# Patient Record
Sex: Male | Born: 1965 | Race: White | Hispanic: No | Marital: Married | Smoking: Never smoker
Health system: Southern US, Community
[De-identification: ages and names within clinical notes are randomized; demographics above are authoritative.]

## PROBLEM LIST (undated history)

## (undated) DIAGNOSIS — I10 Essential (primary) hypertension: Secondary | ICD-10-CM

## (undated) DIAGNOSIS — Z9109 Other allergy status, other than to drugs and biological substances: Secondary | ICD-10-CM

## (undated) DIAGNOSIS — J45909 Unspecified asthma, uncomplicated: Secondary | ICD-10-CM

## (undated) HISTORY — DX: Other allergy status, other than to drugs and biological substances: Z91.09

## (undated) HISTORY — DX: Essential (primary) hypertension: I10

## (undated) HISTORY — DX: Unspecified asthma, uncomplicated: J45.909

---

## 2001-04-18 HISTORY — PX: HERNIA REPAIR: SHX51

## 2001-04-18 HISTORY — PX: VASECTOMY: SHX75

## 2004-01-14 ENCOUNTER — Emergency Department (HOSPITAL_COMMUNITY): Admission: EM | Admit: 2004-01-14 | Discharge: 2004-01-14 | Payer: Self-pay | Admitting: Emergency Medicine

## 2004-02-23 ENCOUNTER — Ambulatory Visit: Payer: Self-pay | Admitting: Psychology

## 2004-03-01 ENCOUNTER — Ambulatory Visit: Payer: Self-pay | Admitting: Pulmonary Disease

## 2004-03-15 ENCOUNTER — Ambulatory Visit: Payer: Self-pay | Admitting: Psychology

## 2004-03-29 ENCOUNTER — Ambulatory Visit: Payer: Self-pay | Admitting: Psychology

## 2004-04-05 ENCOUNTER — Ambulatory Visit: Payer: Self-pay | Admitting: Psychology

## 2004-05-03 ENCOUNTER — Ambulatory Visit: Payer: Self-pay | Admitting: Psychology

## 2004-05-17 ENCOUNTER — Ambulatory Visit: Payer: Self-pay | Admitting: Psychology

## 2004-06-28 ENCOUNTER — Ambulatory Visit: Payer: Self-pay | Admitting: Psychology

## 2004-07-12 ENCOUNTER — Ambulatory Visit: Payer: Self-pay | Admitting: Psychology

## 2004-08-09 ENCOUNTER — Ambulatory Visit: Payer: Self-pay | Admitting: Psychology

## 2004-08-23 ENCOUNTER — Ambulatory Visit: Payer: Self-pay | Admitting: Psychology

## 2004-09-20 ENCOUNTER — Ambulatory Visit: Payer: Self-pay | Admitting: Psychology

## 2004-10-04 ENCOUNTER — Ambulatory Visit: Payer: Self-pay | Admitting: Psychology

## 2004-10-18 ENCOUNTER — Ambulatory Visit: Payer: Self-pay | Admitting: Psychology

## 2004-11-15 ENCOUNTER — Ambulatory Visit: Payer: Self-pay | Admitting: Psychology

## 2004-12-03 ENCOUNTER — Ambulatory Visit: Payer: Self-pay | Admitting: Psychology

## 2004-12-13 ENCOUNTER — Ambulatory Visit: Payer: Self-pay | Admitting: Psychology

## 2004-12-27 ENCOUNTER — Ambulatory Visit: Payer: Self-pay | Admitting: Psychology

## 2005-02-21 ENCOUNTER — Ambulatory Visit: Payer: Self-pay | Admitting: Psychology

## 2005-03-07 ENCOUNTER — Ambulatory Visit: Payer: Self-pay | Admitting: Psychology

## 2005-04-19 ENCOUNTER — Ambulatory Visit: Payer: Self-pay | Admitting: Pulmonary Disease

## 2005-05-02 ENCOUNTER — Ambulatory Visit: Payer: Self-pay | Admitting: Psychology

## 2005-05-16 ENCOUNTER — Ambulatory Visit: Payer: Self-pay | Admitting: Psychology

## 2005-05-30 ENCOUNTER — Ambulatory Visit: Payer: Self-pay | Admitting: Psychology

## 2005-06-13 ENCOUNTER — Ambulatory Visit: Payer: Self-pay | Admitting: Psychology

## 2005-06-27 ENCOUNTER — Ambulatory Visit: Payer: Self-pay | Admitting: Psychology

## 2005-08-08 ENCOUNTER — Ambulatory Visit: Payer: Self-pay | Admitting: Psychology

## 2005-09-19 ENCOUNTER — Ambulatory Visit: Payer: Self-pay | Admitting: Psychology

## 2005-10-03 ENCOUNTER — Ambulatory Visit: Payer: Self-pay | Admitting: Psychology

## 2005-10-24 ENCOUNTER — Ambulatory Visit: Payer: Self-pay | Admitting: Psychology

## 2005-11-14 ENCOUNTER — Ambulatory Visit: Payer: Self-pay | Admitting: Psychology

## 2005-11-17 ENCOUNTER — Ambulatory Visit: Payer: Self-pay | Admitting: Pulmonary Disease

## 2005-11-28 ENCOUNTER — Ambulatory Visit: Payer: Self-pay | Admitting: Psychology

## 2005-12-12 ENCOUNTER — Ambulatory Visit: Payer: Self-pay | Admitting: Pulmonary Disease

## 2005-12-12 ENCOUNTER — Ambulatory Visit: Payer: Self-pay | Admitting: Psychology

## 2006-01-23 ENCOUNTER — Ambulatory Visit: Payer: Self-pay | Admitting: Psychology

## 2006-02-01 ENCOUNTER — Ambulatory Visit: Payer: Self-pay | Admitting: Pulmonary Disease

## 2006-02-06 ENCOUNTER — Ambulatory Visit: Payer: Self-pay | Admitting: Psychology

## 2006-02-23 ENCOUNTER — Ambulatory Visit: Payer: Self-pay | Admitting: Psychology

## 2006-03-06 ENCOUNTER — Ambulatory Visit: Payer: Self-pay | Admitting: Psychology

## 2006-04-17 ENCOUNTER — Ambulatory Visit: Payer: Self-pay | Admitting: Pulmonary Disease

## 2006-05-01 ENCOUNTER — Ambulatory Visit: Payer: Self-pay | Admitting: Psychology

## 2006-05-10 ENCOUNTER — Ambulatory Visit: Payer: Self-pay | Admitting: Pulmonary Disease

## 2006-06-12 ENCOUNTER — Ambulatory Visit: Payer: Self-pay | Admitting: Psychology

## 2006-07-10 ENCOUNTER — Ambulatory Visit: Payer: Self-pay | Admitting: Psychology

## 2006-07-11 ENCOUNTER — Ambulatory Visit: Payer: Self-pay | Admitting: Psychology

## 2006-08-07 ENCOUNTER — Ambulatory Visit: Payer: Self-pay | Admitting: Psychology

## 2006-09-04 ENCOUNTER — Ambulatory Visit: Payer: Self-pay | Admitting: Psychology

## 2006-11-27 ENCOUNTER — Ambulatory Visit: Payer: Self-pay | Admitting: Psychology

## 2006-11-28 ENCOUNTER — Ambulatory Visit: Payer: Self-pay | Admitting: Pulmonary Disease

## 2006-11-28 ENCOUNTER — Ambulatory Visit (HOSPITAL_COMMUNITY): Admission: RE | Admit: 2006-11-28 | Discharge: 2006-11-28 | Payer: Self-pay | Admitting: Pulmonary Disease

## 2007-01-22 ENCOUNTER — Ambulatory Visit: Payer: Self-pay | Admitting: Psychology

## 2007-02-05 ENCOUNTER — Ambulatory Visit: Payer: Self-pay | Admitting: Psychology

## 2007-03-05 ENCOUNTER — Ambulatory Visit: Payer: Self-pay | Admitting: Psychology

## 2007-04-02 ENCOUNTER — Ambulatory Visit: Payer: Self-pay | Admitting: Psychology

## 2007-04-20 ENCOUNTER — Ambulatory Visit: Payer: Self-pay | Admitting: Internal Medicine

## 2007-04-20 DIAGNOSIS — J45909 Unspecified asthma, uncomplicated: Secondary | ICD-10-CM | POA: Insufficient documentation

## 2007-04-20 DIAGNOSIS — J45901 Unspecified asthma with (acute) exacerbation: Secondary | ICD-10-CM | POA: Insufficient documentation

## 2007-06-11 ENCOUNTER — Ambulatory Visit: Payer: Self-pay | Admitting: Psychology

## 2007-06-12 ENCOUNTER — Telehealth: Payer: Self-pay | Admitting: Pulmonary Disease

## 2007-06-14 ENCOUNTER — Ambulatory Visit: Payer: Self-pay | Admitting: Pulmonary Disease

## 2007-06-14 ENCOUNTER — Telehealth (INDEPENDENT_AMBULATORY_CARE_PROVIDER_SITE_OTHER): Payer: Self-pay | Admitting: *Deleted

## 2007-07-09 ENCOUNTER — Ambulatory Visit: Payer: Self-pay | Admitting: Psychology

## 2007-07-23 ENCOUNTER — Ambulatory Visit: Payer: Self-pay | Admitting: Psychology

## 2007-08-06 ENCOUNTER — Ambulatory Visit: Payer: Self-pay | Admitting: Psychology

## 2007-09-03 ENCOUNTER — Ambulatory Visit: Payer: Self-pay | Admitting: Psychology

## 2007-09-17 ENCOUNTER — Ambulatory Visit: Payer: Self-pay | Admitting: Psychology

## 2007-10-08 ENCOUNTER — Ambulatory Visit: Payer: Self-pay | Admitting: Pulmonary Disease

## 2007-10-08 DIAGNOSIS — L723 Sebaceous cyst: Secondary | ICD-10-CM

## 2007-10-08 DIAGNOSIS — I10 Essential (primary) hypertension: Secondary | ICD-10-CM

## 2007-10-09 ENCOUNTER — Encounter: Payer: Self-pay | Admitting: Pulmonary Disease

## 2007-10-15 ENCOUNTER — Ambulatory Visit: Payer: Self-pay | Admitting: Psychology

## 2007-10-23 ENCOUNTER — Telehealth (INDEPENDENT_AMBULATORY_CARE_PROVIDER_SITE_OTHER): Payer: Self-pay | Admitting: *Deleted

## 2007-12-10 ENCOUNTER — Ambulatory Visit: Payer: Self-pay | Admitting: Psychology

## 2008-02-04 ENCOUNTER — Ambulatory Visit: Payer: Self-pay | Admitting: Psychology

## 2008-03-03 ENCOUNTER — Ambulatory Visit: Payer: Self-pay | Admitting: Psychology

## 2008-03-17 ENCOUNTER — Ambulatory Visit: Payer: Self-pay | Admitting: Psychology

## 2008-04-07 ENCOUNTER — Ambulatory Visit: Payer: Self-pay | Admitting: Psychology

## 2008-05-12 ENCOUNTER — Ambulatory Visit: Payer: Self-pay | Admitting: Psychology

## 2008-06-23 ENCOUNTER — Ambulatory Visit: Payer: Self-pay | Admitting: Psychology

## 2008-07-07 ENCOUNTER — Ambulatory Visit: Payer: Self-pay | Admitting: Psychology

## 2008-07-07 ENCOUNTER — Ambulatory Visit: Payer: Self-pay | Admitting: Pulmonary Disease

## 2008-07-07 DIAGNOSIS — E785 Hyperlipidemia, unspecified: Secondary | ICD-10-CM | POA: Insufficient documentation

## 2008-07-10 ENCOUNTER — Telehealth (INDEPENDENT_AMBULATORY_CARE_PROVIDER_SITE_OTHER): Payer: Self-pay | Admitting: *Deleted

## 2008-07-11 ENCOUNTER — Ambulatory Visit: Payer: Self-pay | Admitting: Pulmonary Disease

## 2008-07-11 ENCOUNTER — Ambulatory Visit: Payer: Self-pay | Admitting: Internal Medicine

## 2008-07-21 ENCOUNTER — Ambulatory Visit: Payer: Self-pay | Admitting: Psychology

## 2008-08-04 ENCOUNTER — Ambulatory Visit: Payer: Self-pay | Admitting: Psychology

## 2008-08-18 ENCOUNTER — Ambulatory Visit: Payer: Self-pay | Admitting: Psychology

## 2008-09-08 ENCOUNTER — Ambulatory Visit: Payer: Self-pay | Admitting: Psychology

## 2008-09-29 ENCOUNTER — Ambulatory Visit: Payer: Self-pay | Admitting: Psychology

## 2008-10-27 ENCOUNTER — Ambulatory Visit: Payer: Self-pay | Admitting: Psychology

## 2008-11-17 ENCOUNTER — Ambulatory Visit: Payer: Self-pay | Admitting: Psychology

## 2008-12-23 ENCOUNTER — Ambulatory Visit: Payer: Self-pay | Admitting: Psychology

## 2009-01-08 ENCOUNTER — Encounter: Payer: Self-pay | Admitting: Pulmonary Disease

## 2009-01-19 ENCOUNTER — Ambulatory Visit: Payer: Self-pay | Admitting: Psychology

## 2009-02-02 ENCOUNTER — Ambulatory Visit: Payer: Self-pay | Admitting: Psychology

## 2009-02-11 ENCOUNTER — Encounter: Payer: Self-pay | Admitting: Adult Health

## 2009-02-16 ENCOUNTER — Ambulatory Visit: Payer: Self-pay | Admitting: Psychology

## 2009-03-30 ENCOUNTER — Ambulatory Visit: Payer: Self-pay | Admitting: Psychology

## 2009-04-16 ENCOUNTER — Ambulatory Visit: Payer: Self-pay | Admitting: Sports Medicine

## 2009-04-16 DIAGNOSIS — M2240 Chondromalacia patellae, unspecified knee: Secondary | ICD-10-CM

## 2009-04-16 DIAGNOSIS — S92209A Fracture of unspecified tarsal bone(s) of unspecified foot, initial encounter for closed fracture: Secondary | ICD-10-CM | POA: Insufficient documentation

## 2009-04-16 DIAGNOSIS — S92309A Fracture of unspecified metatarsal bone(s), unspecified foot, initial encounter for closed fracture: Secondary | ICD-10-CM

## 2009-04-27 ENCOUNTER — Ambulatory Visit: Payer: Self-pay | Admitting: Psychology

## 2009-05-25 ENCOUNTER — Ambulatory Visit: Payer: Self-pay | Admitting: Psychology

## 2009-06-08 ENCOUNTER — Ambulatory Visit: Payer: Self-pay | Admitting: Psychology

## 2009-06-22 ENCOUNTER — Ambulatory Visit: Payer: Self-pay | Admitting: Psychology

## 2009-07-06 ENCOUNTER — Ambulatory Visit: Payer: Self-pay | Admitting: Psychology

## 2009-07-20 ENCOUNTER — Ambulatory Visit: Payer: Self-pay | Admitting: Psychology

## 2009-08-31 ENCOUNTER — Ambulatory Visit: Payer: Self-pay | Admitting: Psychology

## 2009-09-15 ENCOUNTER — Ambulatory Visit: Payer: Self-pay | Admitting: Psychology

## 2009-10-12 ENCOUNTER — Ambulatory Visit: Payer: Self-pay | Admitting: Psychology

## 2009-11-09 ENCOUNTER — Ambulatory Visit: Payer: Self-pay | Admitting: Psychology

## 2009-11-23 ENCOUNTER — Ambulatory Visit: Payer: Self-pay | Admitting: Psychology

## 2010-02-01 ENCOUNTER — Ambulatory Visit: Payer: Self-pay | Admitting: Psychology

## 2010-02-15 ENCOUNTER — Ambulatory Visit: Payer: Self-pay | Admitting: Psychology

## 2010-03-15 ENCOUNTER — Ambulatory Visit: Payer: Self-pay | Admitting: Psychology

## 2010-03-29 ENCOUNTER — Ambulatory Visit: Payer: Self-pay | Admitting: Psychology

## 2010-04-15 ENCOUNTER — Ambulatory Visit: Payer: Self-pay | Admitting: Psychology

## 2010-04-26 ENCOUNTER — Ambulatory Visit: Admit: 2010-04-26 | Payer: Self-pay | Admitting: Psychology

## 2010-05-10 ENCOUNTER — Ambulatory Visit
Admission: RE | Admit: 2010-05-10 | Discharge: 2010-05-10 | Payer: Self-pay | Source: Home / Self Care | Attending: Psychology | Admitting: Psychology

## 2010-05-12 ENCOUNTER — Ambulatory Visit
Admission: RE | Admit: 2010-05-12 | Discharge: 2010-05-12 | Payer: Self-pay | Source: Home / Self Care | Attending: Psychology | Admitting: Psychology

## 2010-05-24 ENCOUNTER — Ambulatory Visit (INDEPENDENT_AMBULATORY_CARE_PROVIDER_SITE_OTHER): Payer: 59 | Admitting: Psychology

## 2010-05-24 DIAGNOSIS — F411 Generalized anxiety disorder: Secondary | ICD-10-CM

## 2010-06-07 ENCOUNTER — Ambulatory Visit: Payer: 59 | Admitting: Psychology

## 2010-06-21 ENCOUNTER — Ambulatory Visit: Payer: 59 | Admitting: Psychology

## 2010-07-05 ENCOUNTER — Ambulatory Visit (INDEPENDENT_AMBULATORY_CARE_PROVIDER_SITE_OTHER): Payer: 59 | Admitting: Psychology

## 2010-07-05 DIAGNOSIS — F411 Generalized anxiety disorder: Secondary | ICD-10-CM

## 2010-07-19 ENCOUNTER — Ambulatory Visit (INDEPENDENT_AMBULATORY_CARE_PROVIDER_SITE_OTHER): Payer: 59 | Admitting: Psychology

## 2010-07-19 DIAGNOSIS — F411 Generalized anxiety disorder: Secondary | ICD-10-CM

## 2010-08-02 ENCOUNTER — Ambulatory Visit (INDEPENDENT_AMBULATORY_CARE_PROVIDER_SITE_OTHER): Payer: 59 | Admitting: Psychology

## 2010-08-02 DIAGNOSIS — F411 Generalized anxiety disorder: Secondary | ICD-10-CM

## 2010-08-16 ENCOUNTER — Ambulatory Visit (INDEPENDENT_AMBULATORY_CARE_PROVIDER_SITE_OTHER): Payer: 59 | Admitting: Psychology

## 2010-08-16 DIAGNOSIS — F411 Generalized anxiety disorder: Secondary | ICD-10-CM

## 2010-08-30 ENCOUNTER — Ambulatory Visit: Payer: 59 | Admitting: Psychology

## 2010-08-31 NOTE — Assessment & Plan Note (Signed)
[facility]                                 ON-CALL NOTE   [name], [name]                          MRN:          [id number]  DATE:12/02/2006                            DOB:          09-19-1965    SUBJECTIVE:  [name] is a 45 year old gentleman followed by Dr. [name]  for asthma, as well as general medicine problems. He was seen on  11/27/2006 with an asthma exacerbation. He was treated with Depo-Medrol  and a prednisone taper. His baseline medicines are Symbicort and p.r.n.  albuterol. He was instructed to decrease on the prednisone from 40 mg a  day to 20 mg a day today, however he calls reporting increased symptoms  of shortness of breath and chest tightness. He also has slightly  discolored sputum which is moderate in quantity. He denies hemoptysis  and chest pain.   PLAN:  1. I have called in a prescription for doxycycline 100 mg p.o. b.i.d.      x5 days.  2. I have instructed him to increase the prednisone to 60 mg a day      today and tomorrow.  3. He is to call our office on Monday to arrange follow up with either      Dr. [name] or [facility].     Oley Balm Sung Amabile, MD  Electronically Signed    DBS/MedQ  DD: 12/02/2006  DT: 12/03/2006  Job #: [id number]   cc:   Lonzo Cloud. [name], MD

## 2010-09-03 NOTE — Assessment & Plan Note (Signed)
[facility]                                   ON-CALL NOTE   JOSS, MCDILL                          MRN:          [id number]  DATE:12/31/2005                            DOB:          Mar 27, 1966    Phone message on December 31, 2005, at 3:45 p.m.  Telephone number [phone]  [phone].  Regarding medication.   Mr. Jarboe called stating that he has had another asthma exacerbation.  He has  been on Advair 500 and Singulair and saw the nurse practitioner several  weeks ago.  He has had several dose packs recently.  The problem is he is  heading out of town tomorrow for a week or 2 on business and was concerned  about his asthma.  He requested a Medrol Dosepak and a refill of his  Protonix.   DISPOSITION:  Medrol Dosepak called in to [facility] at [phone],  along with Protonix #30, 1 p.o. q. day, taken 30 minutes before dinner.  He  has an appointment to see me on his return from his business trip.                                   Lonzo Cloud. [name], MD   SMN/MedQ  DD:  12/31/2005  DT:  01/02/2006  Job #:  [id number]

## 2010-09-03 NOTE — Assessment & Plan Note (Signed)
[facility]                             PULMONARY OFFICE NOTE   NAME:[name], [name]                        MRN:          [id number]  DATE:04/17/2006                            DOB:          04/26/1965    HISTORY OF PRESENT ILLNESS:  The patient is a 45 year old white male  patient of Dr. [name], who has a known history of allergic rhinitis and  asthma.  He presents for an acute office visit.  The patient complains  that he continues to have recurrent wheezing, cough and congestion.  The  patient reports that, over the last two months, he has had multiple  episodes of cough and congestion.  Initially, he was seen at urgent care  two months ago and diagnosed with pneumonia and finished a ten-day  course of Avalox.  Then, two weeks ago, he was seen at urgent care while  traveling and was told he had an acute bronchitis and started on Avalox.  The patient had no improvement in symptoms and subsequently was seen two  days later, while on vacation at [place], and changed over to a Z-Pak,  given a Rocephin injection and a Depo-Medrol injection and finished a  prednisone taper.  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  The patient  returns today, complaining that he continues to have intermittent  wheezing, congestion and a feeling that he has congestion build-up in  the back of his throat.  The patient denies any purulent sputum, chest  pain, orthopnea, PND or leg swelling.  The patient does state that,  while he was on vacation at [facility], he got bug bitten on his left  buttock and the area is healing up, but it had initially had significant  redness.  He denies any drainage or pain.   PAST MEDICAL HISTORY:  Reviewed.   CURRENT MEDICATIONS:  Reviewed.   PHYSICAL EXAMINATION:  The patient is a pleasant male, in no acute  distress.  He is afebrile with stable vital signs.  HEENT:   Unremarkable.  NECK:  The neck is supple without adenopathy.  No JVD.  LUNGS:  Reveal coarse breath sounds bilaterally with a few scattered  rhonchi and expiratory wheezes.  CARDIAC:  Regular rate and rhythm.  ABDOMEN:  Soft and benign.  SKIN:  Left buttock revealed a small, less than 1 cm nodule, with no  significant redness noted.  EXTREMITIES:  Warm without any calf tenderness, cyanosis, clubbing or  edema.   DATA:  In-office spirometry revealed an FEV1 of 3.89 L, which is 93% of  the predicted.   IMPRESSION AND PLAN:  1. Asthmatic bronchitis with frequent exacerbations.  The patient has      been recommended change-over to Symbicort 160/4.5 two puffs twice      daily, rinsing well afterwards.  Add Mucinex DM twice daily.  The      patient has been set up for a CT of sinuses and chest x-ray is      pending.  The  patient will recheck here in two weeks with Dr.      [name], or sooner, if needed.  2. Left-buttock nodule, questionably secondary to bug bite.  This area      appears to be healing up well.  The patient is to use warm soaks to      the area.  Cleanse with soap and water.      Rubye Oaks, NP  Electronically Signed      Lonzo Cloud. [name], MD  Electronically Signed   TP/MedQ  DD: 04/19/2006  DT: 04/19/2006  Job #: [id number]

## 2010-09-03 NOTE — Assessment & Plan Note (Signed)
[facility]                               PULMONARY OFFICE NOTE   NAME:Weld, ETHIN DRUMMOND                        MRN:          [id number]  DATE:11/17/2005                            DOB:          1965-12-05    CHIEF COMPLAINT:  Chest congestion and wheezing.   BRIEF HISTORY:  This is a pleasant 45 year old Caucasian male who presents  today with approximately a 1-week history of progressive worsening chest  congestion, preceded by increased nasal congestion, sinus discomfort,  increased postnasal drip, and occasional headache.  He reports he had a  productive cough of yellow-colored sputum from time to time.  He  specifically complains of worsening chest congestion which he notes in the  past has typically required treatment with a prednisone taper. He had  recently been camping and had multiple environmental exposures but not sick  exposures.  He denies fevers and chills, does report subjective increase in  fatigue.   MEDICATIONS:  1.  Singulair 10 mg daily.  2.  Zyrtec 10 mg daily.  3.  Prinivil 10 mg daily.  4.  Alprazolam 0.5 mg p.r.n., 1/2 tab t.i.d.  5.  Advair 500/50 b.i.d.  6.  Lipitor 10 mg q.h.s.   PHYSICAL EXAMINATION:  VITALS:  Weight 201 pounds.  Temperature 98.4.  Blood  pressure 114/64.  Pulse rate 81.  Saturation is 98% on room air.  HEENT:  Head is normocephalic.  There is no JVD.  Bilateral tympanic  membranes are cloudy and slightly injected upon otoscopic exam.  He denies  significant pain to palpation of the frontal and maxillary sinus.  Posterior  pharynx is erythematous with clear posterior drainage.  PULMONARY:  Diffuse expiratory wheezes with occasional rhonchi.  No  accessory muscle use observed.  CARDIAC:  Regular rate and rhythm.  EXTREMITIES:  Warm, nontender.  No edema.   IMPRESSION/PLAN:  Acute asthmatic exacerbation.  It is unclear whether or  not there is a component of infection; however, he is producing  discolored  sputum.  Therefore the plan is to place Mr. Walsh on a prednisone taper  beginning at 40 mg p.o. daily and decreasing by 1 tablet every 3 days until  gone, and then he will complete a Z-Pak as well.  He was instructed that  should his symptoms return or worsen, that he should return for re-  evaluation.                                  Zenia Resides, NP                                Lonzo Cloud. [name], MD   PB/MedQ  DD:  11/17/2005  DT:  11/17/2005  Job #:  [id number]

## 2010-09-03 NOTE — Assessment & Plan Note (Signed)
[facility]                               PULMONARY OFFICE NOTE   NAME:[name], [name]                        MRN:          [id number]  DATE:12/12/2005                            DOB:          [birth year]    HISTORY OF PRESENT ILLNESS:  The patient is a 45 year old white male patient  of Dr. Jodelle Simpson, who has a known history of asthma. He presents for a  persistent cough, wheezing, and shortness of breath. The patient was seen  approximately 3 weeks ago for an acute asthmatic exacerbation and prescribed  a Z-pack and prednisone taper. The patient reports that he had minimal  improvement in symptoms with persistent wheezing, cough, congestion over the  last several weeks. The patient complains he has been quite fatigued and has  occasional heartburn. The patient denies any hemoptysis, chest pain,  orthopnea, PND, leg swelling, joint pain. The patient does complain  occasionally of having heartburn symptoms along with constipation and loose  stools intermittently. The patient denies any diarrhea. This seems to be  more chronic in nature and seems to be increased with stress related  activities. Denies any weight loss.   PAST MEDICAL HISTORY:  Reviewed.   CURRENT MEDICATIONS:  Reviewed.   PHYSICAL EXAMINATION:  GENERAL:  The patient is a pleasant white male in no  acute distress.  VITAL SIGNS:  He is afebrile with stable vital signs. O2 saturation is 97%  on room air.  HEENT:  Nasal mucosa is with some moderate erythema. Non-tender sinuses.  NECK:  Supple without adenopathy. No JVD.  LUNGS:  Sounds reveal expiratory wheezes with some upper airway pseudo-  wheezing.  CARDIAC:  Regular rate and rhythm.  ABDOMEN:  Soft, nontender.  EXTREMITIES:  Warm without any calf clubbing, cyanosis, or edema.   IMPRESSION:  Acute exacerbation of asthmatic bronchitis with persistent  symptoms, which has been slow to resolve.   PLAN:  The patient is on an ace  inhibitor, which could be contributing to  his upper airway irritation. Therefore, we will hold his Lisinopril for now  and change over to Benicar 40 mg. Samples were given x1 month and he will  return here in 1 month for followup. The patient has not been rinsing after  Advair and therefore, has been given patient education to rinse thoroughly  after each use. He will add in Protonix 40 mg daily over the next 2 weeks  for any residual reflux that may be irritating the airways. He will begin a  prednisone taper over the next week. Add in Mucinex DM twice daily. And may  use Indol HD #4 ounces 1 to 2 teaspoons every 4 to 6 hours as needed for  cough. A chest x-ray is pending at the time of dictation. The patient will  re-check  here in 3 to 4 weeks with Dr. [name] for complete physical examination and  followup of his asthma and high blood pressure.  Rubye Oaks, NP                                [name]. Charles Basque, MD   TP/MedQ  DD:  12/12/2005  DT:  12/13/2005  Job #:  [id number]

## 2010-09-14 ENCOUNTER — Ambulatory Visit: Payer: 59 | Admitting: Psychology

## 2010-09-27 ENCOUNTER — Ambulatory Visit: Payer: 59 | Admitting: Psychology

## 2010-10-11 ENCOUNTER — Ambulatory Visit (INDEPENDENT_AMBULATORY_CARE_PROVIDER_SITE_OTHER): Payer: 59 | Admitting: Psychology

## 2010-10-11 DIAGNOSIS — F411 Generalized anxiety disorder: Secondary | ICD-10-CM

## 2010-10-25 ENCOUNTER — Ambulatory Visit (INDEPENDENT_AMBULATORY_CARE_PROVIDER_SITE_OTHER): Payer: 59 | Admitting: Psychology

## 2010-10-25 DIAGNOSIS — F411 Generalized anxiety disorder: Secondary | ICD-10-CM

## 2010-11-15 ENCOUNTER — Ambulatory Visit: Payer: 59 | Admitting: Psychology

## 2010-12-06 ENCOUNTER — Ambulatory Visit: Payer: 59 | Admitting: Psychology

## 2010-12-28 ENCOUNTER — Ambulatory Visit (INDEPENDENT_AMBULATORY_CARE_PROVIDER_SITE_OTHER): Payer: 59 | Admitting: Psychology

## 2010-12-28 DIAGNOSIS — F411 Generalized anxiety disorder: Secondary | ICD-10-CM

## 2011-01-17 ENCOUNTER — Ambulatory Visit: Payer: 59 | Admitting: Psychology

## 2011-02-14 ENCOUNTER — Ambulatory Visit: Payer: 59 | Admitting: Psychology

## 2011-02-28 ENCOUNTER — Ambulatory Visit (INDEPENDENT_AMBULATORY_CARE_PROVIDER_SITE_OTHER): Payer: 59 | Admitting: Psychology

## 2011-02-28 DIAGNOSIS — F411 Generalized anxiety disorder: Secondary | ICD-10-CM

## 2011-03-14 ENCOUNTER — Ambulatory Visit (INDEPENDENT_AMBULATORY_CARE_PROVIDER_SITE_OTHER): Payer: 59 | Admitting: Psychology

## 2011-03-14 DIAGNOSIS — F411 Generalized anxiety disorder: Secondary | ICD-10-CM

## 2011-03-28 ENCOUNTER — Ambulatory Visit (INDEPENDENT_AMBULATORY_CARE_PROVIDER_SITE_OTHER): Payer: 59 | Admitting: Psychology

## 2011-03-28 DIAGNOSIS — F409 Phobic anxiety disorder, unspecified: Secondary | ICD-10-CM

## 2011-03-29 ENCOUNTER — Ambulatory Visit (INDEPENDENT_AMBULATORY_CARE_PROVIDER_SITE_OTHER): Payer: 59 | Admitting: Sports Medicine

## 2011-03-29 DIAGNOSIS — M2240 Chondromalacia patellae, unspecified knee: Secondary | ICD-10-CM

## 2011-04-05 NOTE — Progress Notes (Signed)
This was a no show visit.  No charge.

## 2011-04-11 ENCOUNTER — Ambulatory Visit: Payer: 59 | Admitting: Psychology

## 2011-04-20 ENCOUNTER — Ambulatory Visit: Payer: 59 | Admitting: Sports Medicine

## 2011-04-25 ENCOUNTER — Ambulatory Visit: Payer: 59 | Admitting: Psychology

## 2011-05-02 ENCOUNTER — Ambulatory Visit (INDEPENDENT_AMBULATORY_CARE_PROVIDER_SITE_OTHER): Payer: 59 | Admitting: Sports Medicine

## 2011-05-02 ENCOUNTER — Encounter: Payer: Self-pay | Admitting: Sports Medicine

## 2011-05-02 VITALS — BP 143/89 | HR 67 | Ht 70.0 in | Wt 175.0 lb

## 2011-05-02 DIAGNOSIS — M25562 Pain in left knee: Secondary | ICD-10-CM | POA: Insufficient documentation

## 2011-05-02 DIAGNOSIS — M7711 Lateral epicondylitis, right elbow: Secondary | ICD-10-CM

## 2011-05-02 DIAGNOSIS — M25569 Pain in unspecified knee: Secondary | ICD-10-CM

## 2011-05-02 DIAGNOSIS — M7710 Lateral epicondylitis, unspecified elbow: Secondary | ICD-10-CM

## 2011-05-02 MED ORDER — NITROGLYCERIN 0.2 MG/HR TD PT24: MEDICATED_PATCH | TRANSDERMAL | Status: DC
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
## 2011-05-02 NOTE — Assessment & Plan Note (Signed)
The patient is to do a standard set of exercises to rehabilitate the elbow Use a compression sleeve when riding Start on nitroglycerin protocol with one quarter patch per day Recheck in 6 weeks

## 2011-05-09 ENCOUNTER — Ambulatory Visit (INDEPENDENT_AMBULATORY_CARE_PROVIDER_SITE_OTHER): Payer: 59 | Admitting: Psychology

## 2011-05-09 DIAGNOSIS — F411 Generalized anxiety disorder: Secondary | ICD-10-CM

## 2011-05-23 ENCOUNTER — Ambulatory Visit (INDEPENDENT_AMBULATORY_CARE_PROVIDER_SITE_OTHER): Payer: 59 | Admitting: Psychology

## 2011-05-23 DIAGNOSIS — F411 Generalized anxiety disorder: Secondary | ICD-10-CM

## 2011-06-06 ENCOUNTER — Ambulatory Visit: Payer: 59 | Admitting: Psychology

## 2011-06-13 ENCOUNTER — Ambulatory Visit (INDEPENDENT_AMBULATORY_CARE_PROVIDER_SITE_OTHER): Payer: 59 | Admitting: Sports Medicine

## 2011-06-13 VITALS — BP 130/84

## 2011-06-13 DIAGNOSIS — M7710 Lateral epicondylitis, unspecified elbow: Secondary | ICD-10-CM

## 2011-06-13 DIAGNOSIS — M7711 Lateral epicondylitis, right elbow: Secondary | ICD-10-CM

## 2011-06-13 NOTE — Patient Instructions (Signed)
1. Continue with your exercises.  Increase your weight from 1lbs to 3 lbs up to 5 lbs over the next 6 weeks.  2. If you have pain in the elbow put your sleeve on for about an hour.  3. It is ok to use advil or aleve for your pain.  4. Follow up with [place] in 6 weeks for a repeat ultrasound.

## 2011-06-13 NOTE — Assessment & Plan Note (Addendum)
We will increase the resistance for his HEP.  He will continue with the elbow sleeve with activity and for pain relief PRN during the day.  OTC anti-inflammatory meds are ok PRN pain.
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
## 2011-06-20 ENCOUNTER — Ambulatory Visit (INDEPENDENT_AMBULATORY_CARE_PROVIDER_SITE_OTHER): Payer: 59 | Admitting: Psychology

## 2011-06-20 DIAGNOSIS — F411 Generalized anxiety disorder: Secondary | ICD-10-CM

## 2011-07-04 ENCOUNTER — Ambulatory Visit: Payer: 59 | Admitting: Psychology

## 2011-07-18 ENCOUNTER — Ambulatory Visit: Payer: 59 | Admitting: Psychology

## 2011-07-25 ENCOUNTER — Encounter: Payer: Self-pay | Admitting: Sports Medicine

## 2011-07-25 ENCOUNTER — Ambulatory Visit (INDEPENDENT_AMBULATORY_CARE_PROVIDER_SITE_OTHER): Payer: 59 | Admitting: Sports Medicine

## 2011-07-25 VITALS — BP 132/90 | HR 68

## 2011-07-25 DIAGNOSIS — M7710 Lateral epicondylitis, unspecified elbow: Secondary | ICD-10-CM

## 2011-07-25 DIAGNOSIS — M7711 Lateral epicondylitis, right elbow: Secondary | ICD-10-CM
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
## 2011-07-25 NOTE — Assessment & Plan Note (Signed)
About 50% better  Be more consistent with exercises  Add some ball squeezes  Reck6 wks

## 2011-07-25 NOTE — Patient Instructions (Signed)
It was good to see you today! We would like you to start using some topical aspirin on your elbow. Continue your exercises.  Try to do them a bit more often as that will speed the healing process.  Try to increase the range of motion of your exercises.  Also start doing some grip exercises with balls. Come back to see [place] in 6 weeks.

## 2011-08-01 ENCOUNTER — Ambulatory Visit: Payer: 59 | Admitting: Psychology

## 2011-08-15 ENCOUNTER — Ambulatory Visit (INDEPENDENT_AMBULATORY_CARE_PROVIDER_SITE_OTHER): Payer: 59 | Admitting: Psychology

## 2011-08-15 DIAGNOSIS — F411 Generalized anxiety disorder: Secondary | ICD-10-CM

## 2011-08-29 ENCOUNTER — Ambulatory Visit: Payer: 59 | Admitting: Psychology

## 2011-09-06 ENCOUNTER — Ambulatory Visit: Payer: 59 | Admitting: Sports Medicine

## 2011-09-13 ENCOUNTER — Ambulatory Visit: Payer: 59 | Admitting: Psychology

## 2011-09-26 ENCOUNTER — Ambulatory Visit: Payer: 59 | Admitting: Psychology

## 2011-10-24 ENCOUNTER — Ambulatory Visit (INDEPENDENT_AMBULATORY_CARE_PROVIDER_SITE_OTHER): Payer: 59 | Admitting: Psychology

## 2011-10-24 DIAGNOSIS — F411 Generalized anxiety disorder: Secondary | ICD-10-CM

## 2011-11-07 ENCOUNTER — Ambulatory Visit (INDEPENDENT_AMBULATORY_CARE_PROVIDER_SITE_OTHER): Payer: 59 | Admitting: Psychology

## 2011-11-07 DIAGNOSIS — F411 Generalized anxiety disorder: Secondary | ICD-10-CM

## 2011-11-21 ENCOUNTER — Ambulatory Visit (INDEPENDENT_AMBULATORY_CARE_PROVIDER_SITE_OTHER): Payer: 59 | Admitting: Psychology

## 2011-11-21 DIAGNOSIS — F411 Generalized anxiety disorder: Secondary | ICD-10-CM

## 2011-12-05 ENCOUNTER — Ambulatory Visit: Payer: 59 | Admitting: Psychology

## 2011-12-15 ENCOUNTER — Encounter: Payer: Self-pay | Admitting: Emergency Medicine

## 2011-12-15 ENCOUNTER — Ambulatory Visit (INDEPENDENT_AMBULATORY_CARE_PROVIDER_SITE_OTHER): Payer: Managed Care, Other (non HMO) | Admitting: Emergency Medicine

## 2011-12-15 ENCOUNTER — Institutional Professional Consult (permissible substitution): Payer: 59 | Admitting: Emergency Medicine

## 2011-12-15 VITALS — BP 118/80 | HR 64 | Temp 98.1°F | Ht 70.0 in | Wt 188.4 lb

## 2011-12-15 DIAGNOSIS — J45909 Unspecified asthma, uncomplicated: Secondary | ICD-10-CM

## 2011-12-15 LAB — PULMONARY FUNCTION TEST

## 2011-12-15 NOTE — Progress Notes (Signed)
Subjective:    Patient ID: [name], male    DOB: 06-11-1965, 46 y.o.   MRN: [id number] HPI 46 yo never smoker, with a hx of allergic rhinitis (off immunotherapy x 1 year), GERD, HTN (on benazepril/HCTZ until 11/10/11), hyperlipidemia, anxiety. Reports a hx of lifelong asthma, first dx as a child. He has had spirometry, but doesn't recall full PFT. He is currently on Symbicort, Singulair, albuterol prn. His allergy regimen is . He is very active, cycles and is in very good shape. Over the last 2 years he has noticed continued dry cough, noise when he breathes (? Wheeze vs stridor). Has been on zyrtec for years, also on nasal steroid.  He omeprazole qd. Triggers - allergies, not exercise, perfumes, smoke, not air temperature.   Review of Systems  Constitutional: Positive for fatigue. Negative for fever, chills, diaphoresis, activity change, appetite change and unexpected weight change.  HENT: Negative for ear pain, nosebleeds, congestion, sore throat, rhinorrhea, sneezing, mouth sores, trouble swallowing, dental problem, voice change, postnasal drip and sinus pressure.   Eyes: Negative for visual disturbance.  Respiratory: Positive for cough, chest tightness, shortness of breath and wheezing. Negative for apnea, choking and stridor.   Cardiovascular: Negative for chest pain, palpitations and leg swelling.  Gastrointestinal: Negative for nausea, vomiting, abdominal pain and constipation.  Genitourinary: Negative for difficulty urinating.  Musculoskeletal: Negative for myalgias, back pain and gait problem.  Skin: Positive for pallor. Negative for rash.  Neurological: Negative for dizziness, tremors, syncope, weakness, light-headedness and headaches.  Hematological: Does not bruise/bleed easily.  Psychiatric/Behavioral: Negative for confusion, disturbed wake/sleep cycle and agitation. The patient is not nervous/anxious.     Past Medical History  Diagnosis Date  . Hypertension     since age 17   . Asthma   . Environmental allergies      Family History  Problem Relation Age of Onset  . Hypertension Father   . Cancer Father     lung  . Cancer Mother     ovarian/breast  . Heart attack Mother   . Hypertension Mother      History   Social History  . Marital Status: Married    Spouse Name: N/A    Number of Children: 2  . Years of Education: N/A   Occupational History  . PRESIDENT    Social History Main Topics  . Smoking status: Never Smoker   . Smokeless tobacco: Never Used  . Alcohol Use: Not on file  . Drug Use: Not on file  . Sexually Active: Not on file   Other Topics Concern  . Not on file   Social History Narrative  . No narrative on file  works in advertising,   Allergies  Allergen Reactions  . Doxycycline   . Tetracycline      Current outpatient prescriptions:amLODipine-olmesartan (AZOR) 10-40 MG per tablet, Take 1 tablet by mouth daily., Disp: , Rfl: ;  azelastine (ASTELIN) 137 MCG/SPRAY nasal spray, Inhale 1 puff into the lungs Twice daily., Disp: , Rfl: ;  cetirizine (ZYRTEC) 10 MG tablet, Take 10 mg by mouth daily., Disp: , Rfl: ;  fluticasone (FLONASE) 50 MCG/ACT nasal spray, Inhale 1 puff into the lungs daily., Disp: , Rfl:  montelukast (SINGULAIR) 10 MG tablet, Take 10 mg by mouth at bedtime., Disp: , Rfl: ;  omeprazole (PRILOSEC) 20 MG capsule, Take 20 mg by mouth daily., Disp: , Rfl: ;  PROAIR HFA 108 (90 BASE) MCG/ACT inhaler, as needed., Disp: , Rfl: ;  SYMBICORT 160-4.5 MCG/ACT inhaler, Inhale 2 puffs into the lungs Twice daily., Disp: , Rfl:       Objective:   Physical Exam Filed Vitals:   12/15/11 1512  BP: 118/80  Pulse: 64  Temp: 98.1 F (36.7 C)   Gen: Pleasant, well-nourished, in no distress,  normal affect  ENT: No lesions,  mouth clear,  oropharynx clear, no postnasal drip  Neck: No JVD, no TMG, no carotid bruits  Lungs: No use of accessory muscles, inspiratory rhonchi on the R  Cardiovascular: RRR, heart sounds  normal, no murmur or gallops, no peripheral edema  Musculoskeletal: No deformities, no cyanosis or clubbing  Neuro: alert, non focal  Skin: Warm, no lesions or rashes      Assessment & Plan:  ASTHMA Suspect he has a combination of asthma + UA irritation and intermittent stridor - allergies probably biggest factor, also with GERD. I suspect he will need to go back on allergy shots. Until then, we will increase his PPI to see if it helps. He is on a good allergy regimen.  Continue zyrtec and Singulair + nasal steroid Change albuterol to prn symbicort bid Omeprazole to bid Full PFT rov 1 month

## 2011-12-15 NOTE — Assessment & Plan Note (Addendum)
Suspect he has a combination of asthma + UA irritation and intermittent stridor - allergies probably biggest factor, also with GERD. I suspect he will need to go back on allergy shots. Until then, we will increase his PPI to see if it helps. He is on a good allergy regimen.  Continue zyrtec and Singulair + nasal steroid Change albuterol to prn symbicort bid Omeprazole to bid Full PFT rov 1 month

## 2011-12-15 NOTE — Progress Notes (Signed)
PFT done today. 

## 2011-12-15 NOTE — Patient Instructions (Addendum)
Please increase your omeprazole to twice a day until our next visit Use your albuterol 2 puffs as needed (not on a schedule) Continue your other medications as you are taking them We will perform full pulmonary function testing today Follow with Dr Delton Coombes in 1 month

## 2011-12-16 ENCOUNTER — Telehealth: Payer: Self-pay | Admitting: *Deleted

## 2011-12-16 NOTE — Telephone Encounter (Signed)
Patients PFT results sent to PCP--Dr. Polite @ Fax No: [phone] per request.

## 2011-12-26 ENCOUNTER — Encounter: Payer: Self-pay | Admitting: Emergency Medicine

## 2012-01-02 ENCOUNTER — Ambulatory Visit: Payer: Managed Care, Other (non HMO) | Admitting: Psychology

## 2012-01-16 ENCOUNTER — Ambulatory Visit (INDEPENDENT_AMBULATORY_CARE_PROVIDER_SITE_OTHER): Payer: Managed Care, Other (non HMO) | Admitting: Psychology

## 2012-01-16 DIAGNOSIS — F411 Generalized anxiety disorder: Secondary | ICD-10-CM

## 2012-01-17 ENCOUNTER — Ambulatory Visit: Payer: Managed Care, Other (non HMO) | Admitting: Emergency Medicine

## 2012-01-19 ENCOUNTER — Ambulatory Visit (INDEPENDENT_AMBULATORY_CARE_PROVIDER_SITE_OTHER)
Admission: RE | Admit: 2012-01-19 | Discharge: 2012-01-19 | Disposition: A | Discharge status: Home / Self Care | Payer: Managed Care, Other (non HMO) | Source: Ambulatory Visit | Attending: Adult Health | Admitting: Adult Health

## 2012-01-19 ENCOUNTER — Encounter: Payer: Self-pay | Admitting: Adult Health

## 2012-01-19 ENCOUNTER — Ambulatory Visit (INDEPENDENT_AMBULATORY_CARE_PROVIDER_SITE_OTHER): Payer: Managed Care, Other (non HMO) | Admitting: Adult Health

## 2012-01-19 VITALS — BP 126/78 | HR 73 | Temp 97.3°F | Ht 70.0 in | Wt 189.8 lb

## 2012-01-19 DIAGNOSIS — Z23 Encounter for immunization: Secondary | ICD-10-CM

## 2012-01-19 DIAGNOSIS — J45909 Unspecified asthma, uncomplicated: Secondary | ICD-10-CM

## 2012-01-19 NOTE — Patient Instructions (Addendum)
Continue on Symbicort 2 puffs Twice daily  -brush/rinse /gargle after use.  Continue on  Omeprazole Twice daily   Add Chlor trimeton 4 mg 1-2 At bedtime  Drainage.  Continue on nasal sprays  Continue on Zyrtec daily  Mucinex DM Twice daily  As needed  Cough/congesiton  Need to avoid Ace Inhibitors- discuss with family doctor.  I will call with xray results.  Follow up Dr. Delton Coombes  In 1 month and As needed

## 2012-01-20 ENCOUNTER — Ambulatory Visit: Payer: Managed Care, Other (non HMO) | Admitting: Emergency Medicine

## 2012-01-20 NOTE — Assessment & Plan Note (Addendum)
Does not sound like asthma  exacerbation as exercise seems to help his symptoms  PFT nml during cough episodes Suspect cough is multifactoral in setting of ACE inhibitor  CXR today nml   Plan Continue on Symbicort 2 puffs Twice daily  -brush/rinse /gargle after use.  Continue on  Omeprazole Twice daily   Add Chlor trimeton 4 mg 1-2 At bedtime  Drainage.  Continue on nasal sprays  Continue on Zyrtec daily  Mucinex DM Twice daily  As needed  Cough/congesiton  Need to avoid Ace Inhibitors- discuss with family doctor.    Follow up Dr. Delton Coombes  In 1 month and As needed

## 2012-01-20 NOTE — Progress Notes (Signed)
Quick Note:  Called spoke with patient, advised of cxr results / recs as stated by TP. Pt verbalized his understanding and denied any questions. ______ 

## 2012-01-20 NOTE — Progress Notes (Signed)
Subjective:    Patient ID: [name], male    DOB: 09/18/1965, 46 y.o.   MRN: [id number]  HPI 46 yo never smoker, with a hx of allergic rhinitis (off immunotherapy x 1 year), GERD, HTN (on benazepril/HCTZ until 11/10/11), hyperlipidemia, anxiety.   12/15/11 Pulmonary consult with Dr. Delton Coombes  (prev. pulm pt of Dr. [name]  )  Reports a hx of lifelong asthma, first dx as a child. He has had spirometry, but doesn't recall full PFT. He is currently on Symbicort, Singulair, albuterol prn. His allergy regimen is . He is very active, cycles and is in very good shape. Over the last 2 years he has noticed continued dry cough, noise when he breathes (? Wheeze vs stridor). Has been on zyrtec for years, also on nasal steroid.  He omeprazole qd. Triggers - allergies, not exercise, perfumes, smoke, not air temperature.  >PPI increased Twice daily    01/19/12 Follow up  Returns for follow up Still having some dry cough, small amounts of clear mucus, gurgling in right lung Of note he is back on ACE inhibitor. Does not like ARB causes weakness Have encouraged him that this will aggravate his cough and asthma.  Needs to remain off and discuss with PCP alternative b/p rx.  Road in 100 mile bike ride few days ago. Cough first few minutes then fine.  Feels better when he is exercising. But has daily cough and rattle in chest . No discolored mucus or fever . No edema.  PFT last ov w/ nml FEV1       Past Medical History: (previous pulm  pt of Dr. [name] )  1. ASTHMA (ICD-493.90)--IGE 16.3, neg rast 04/2006, CT sinus neg 12/07  spirometry 3/06--FEV1 4.05 (90%), ratio 83%, CXR 8/08 neg.  2. Allergic Rhintis--prev. eval w/ Dr. Nira Retort [place] prev. on allergy vaccines.  3. HTN-controlled on rx amlodipine/benazapril- stopped July 07, 2008 -ACE. changed to amlodipine 5mg  and HCTZ 12.5mg   4. Hyperlipdiemia-TC 249/LDL 160/TG 172/HDL 34 --exercising, wt loss/diet and niacin 1000mg  /Fish oil at bedtime (10/07)  3. GERD    4. Anal fissure 12/90- Dr. Jarold Motto    Review of Systems Constitutional:   No  weight loss, night sweats,  Fevers, chills, fatigue, or  lassitude.  HEENT:   No headaches,  Difficulty swallowing,  Tooth/dental problems, or  Sore throat,                No sneezing, itching, ear ache, nasal congestion, post nasal drip,   CV:  No chest pain,  Orthopnea, PND, swelling in lower extremities, anasarca, dizziness, palpitations, syncope.   GI  No heartburn, indigestion, abdominal pain, nausea, vomiting, diarrhea, change in bowel habits, loss of appetite, bloody stools.   Resp:    No coughing up of blood.  No change in color of mucus.     No chest wall deformity  Skin: no rash or lesions.  GU: no dysuria, change in color of urine, no urgency or frequency.  No flank pain, no hematuria   MS:  No joint pain or swelling.  No decreased range of motion.  No back pain.  Psych:  No change in mood or affect. No depression or anxiety.  No memory loss.         Objective:   Physical Exam GEN: A/Ox3; pleasant , NAD, well nourished   HEENT:  [place]/AT,  EACs-clear, TMs-wnl, NOSE-clear, THROAT-clear, no lesions, no postnasal drip or exudate noted.   NECK:  Supple w/ fair ROM;  no JVD; normal carotid impulses w/o bruits; no thyromegaly or nodules palpated; no lymphadenopathy.  RESP  Coarse BS w/ few rhonchi -no accessory muscle use, no dullness to percussion  CARD:  RRR, no m/r/g  , no peripheral edema, pulses intact, no cyanosis or clubbing.  GI:   Soft & nt; nml bowel sounds; no organomegaly or masses detected.  Musco: Warm bil, no deformities or joint swelling noted.   Neuro: alert, no focal deficits noted.    Skin: Warm, no lesions or rashes         Assessment & Plan:

## 2012-01-30 ENCOUNTER — Ambulatory Visit: Payer: Managed Care, Other (non HMO) | Admitting: Psychology

## 2012-02-13 ENCOUNTER — Ambulatory Visit: Payer: Managed Care, Other (non HMO) | Admitting: Psychology

## 2012-02-15 ENCOUNTER — Ambulatory Visit (INDEPENDENT_AMBULATORY_CARE_PROVIDER_SITE_OTHER): Payer: Managed Care, Other (non HMO) | Admitting: Psychology

## 2012-02-15 DIAGNOSIS — F411 Generalized anxiety disorder: Secondary | ICD-10-CM

## 2012-02-16 ENCOUNTER — Ambulatory Visit: Payer: Managed Care, Other (non HMO) | Admitting: Psychology

## 2012-02-22 ENCOUNTER — Encounter: Payer: Self-pay | Admitting: Emergency Medicine

## 2012-02-22 ENCOUNTER — Ambulatory Visit (INDEPENDENT_AMBULATORY_CARE_PROVIDER_SITE_OTHER): Payer: Managed Care, Other (non HMO) | Admitting: Emergency Medicine

## 2012-02-22 VITALS — BP 140/90 | HR 73 | Temp 97.4°F | Ht 70.0 in | Wt 137.2 lb

## 2012-02-22 DIAGNOSIS — J45909 Unspecified asthma, uncomplicated: Secondary | ICD-10-CM

## 2012-02-22 NOTE — Progress Notes (Signed)
  Subjective:    Patient ID: [name], male    DOB: 01-25-1966, 46 y.o.   MRN: [id number]  HPI 46 yo never smoker, with a hx of allergic rhinitis (off immunotherapy x 1 year), GERD, HTN (on benazepril/HCTZ until 11/10/11), hyperlipidemia, anxiety.   12/15/11 Pulmonary consult with Dr. Delton Coombes  (prev. pulm pt of Dr. [name]  )  Reports a hx of lifelong asthma, first dx as a child. He has had spirometry, but doesn't recall full PFT. He is currently on Symbicort, Singulair, albuterol prn. His allergy regimen is . He is very active, cycles and is in very good shape. Over the last 2 years he has noticed continued dry cough, noise when he breathes (? Wheeze vs stridor). Has been on zyrtec for years, also on nasal steroid.  He omeprazole qd. Triggers - allergies, not exercise, perfumes, smoke, not air temperature.  >PPI increased Twice daily    01/19/12 Follow up  Returns for follow up Still having some dry cough, small amounts of clear mucus, gurgling in right lung Of note he is back on ACE inhibitor. Does not like ARB causes weakness Have encouraged him that this will aggravate his cough and asthma.  Needs to remain off and discuss with PCP alternative b/p rx.  Road in 100 mile bike ride few days ago. Cough first few minutes then fine.  Feels better when he is exercising. But has daily cough and rattle in chest . No discolored mucus or fever . No edema.  PFT last ov w/ nml FEV1  ROV 02/22/12 -- hx asthma, chronic cough, allergies. Currently on zyrtec, fluticasone, astelin, singulair, omeprazole, symbicort.  He is back on ACE-I, feels that the cough and UA irritation are minimal as long as he is on mucinex-dm, chlortrimaton. Asthma is better now that allergies are well controlled.    Past Medical History: (previous pulm  pt of Dr. [name] )  1. ASTHMA (ICD-493.90)--IGE 16.3, neg rast 04/2006, CT sinus neg 12/07  spirometry 3/06--FEV1 4.05 (90%), ratio 83%, CXR 8/08 neg.  2. Allergic Rhintis--prev. eval  w/ Dr. Nira Retort [place] prev. on allergy vaccines.  3. HTN-controlled on rx amlodipine/benazapril- stopped July 07, 2008 -ACE. changed to amlodipine 5mg  and HCTZ 12.5mg   4. Hyperlipdiemia-TC 249/LDL 160/TG 172/HDL 34 --exercising, wt loss/diet and niacin 1000mg  /Fish oil at bedtime (10/07)  3. GERD  4. Anal fissure 12/90- Dr. Jarold Motto       Objective:   Physical Exam Filed Vitals:   02/22/12 1607  BP: 140/90  Pulse: 73  Temp: 97.4 F (36.3 C)   GEN: A/Ox3; pleasant , NAD, well nourished   HEENT:  [place]/AT,  EACs-clear, TMs-wnl, NOSE-clear, THROAT-clear, no lesions, no postnasal drip or exudate noted.   NECK:  Supple w/ fair ROM; no JVD; normal carotid impulses w/o bruits; no thyromegaly or nodules palpated; no lymphadenopathy.  RESP  Coarse BS w/ few rhonchi -no accessory muscle use, no dullness to percussion  CARD:  RRR, no m/r/g  , no peripheral edema, pulses intact, no cyanosis or clubbing.  Musco: Warm bil, no deformities or joint swelling noted.   Neuro: alert, no focal deficits noted.    Skin: Warm, no lesions or rashes      Assessment & Plan:  ASTHMA - will continue same regimen for now - discussed looking for an alternative to ACE-I or ARB (he didn't tolerate); he will speak w Dr Nehemiah Settle about this.  - rov 6 months

## 2012-02-22 NOTE — Patient Instructions (Addendum)
Please continue your current medications Discuss with Dr Nehemiah Settle the alternatives to ACE-I for BP regimen.  Follow with Dr Delton Coombes in 6 months or sooner if you have any problems

## 2012-02-22 NOTE — Assessment & Plan Note (Addendum)
-   will continue same regimen for now - discussed looking for an alternative to ACE-I or ARB (he didn't tolerate); he will speak w Dr Nehemiah Settle about this.  - rov 6 months

## 2012-02-27 ENCOUNTER — Ambulatory Visit: Payer: Managed Care, Other (non HMO) | Admitting: Psychology

## 2012-03-12 ENCOUNTER — Ambulatory Visit: Payer: Self-pay | Admitting: Psychology

## 2012-04-23 ENCOUNTER — Ambulatory Visit: Payer: Managed Care, Other (non HMO) | Admitting: Psychology

## 2012-05-07 ENCOUNTER — Ambulatory Visit (INDEPENDENT_AMBULATORY_CARE_PROVIDER_SITE_OTHER): Payer: Managed Care, Other (non HMO) | Admitting: Psychology

## 2012-05-07 DIAGNOSIS — F411 Generalized anxiety disorder: Secondary | ICD-10-CM

## 2012-05-21 ENCOUNTER — Ambulatory Visit: Payer: Managed Care, Other (non HMO) | Admitting: Psychology

## 2012-06-18 ENCOUNTER — Ambulatory Visit (INDEPENDENT_AMBULATORY_CARE_PROVIDER_SITE_OTHER): Payer: BC Managed Care – PPO | Admitting: Psychology

## 2012-06-18 DIAGNOSIS — F411 Generalized anxiety disorder: Secondary | ICD-10-CM

## 2012-07-02 ENCOUNTER — Ambulatory Visit: Payer: Self-pay | Admitting: Psychology

## 2012-07-16 ENCOUNTER — Ambulatory Visit (INDEPENDENT_AMBULATORY_CARE_PROVIDER_SITE_OTHER): Payer: BC Managed Care – PPO | Admitting: Psychology

## 2012-07-16 DIAGNOSIS — F411 Generalized anxiety disorder: Secondary | ICD-10-CM

## 2012-07-30 ENCOUNTER — Ambulatory Visit: Payer: BC Managed Care – PPO | Admitting: Psychology

## 2012-08-13 ENCOUNTER — Ambulatory Visit (INDEPENDENT_AMBULATORY_CARE_PROVIDER_SITE_OTHER): Payer: BC Managed Care – PPO | Admitting: Psychology

## 2012-08-13 DIAGNOSIS — F411 Generalized anxiety disorder: Secondary | ICD-10-CM

## 2012-08-24 ENCOUNTER — Ambulatory Visit: Payer: BC Managed Care – PPO | Admitting: Psychology

## 2012-09-24 ENCOUNTER — Ambulatory Visit: Payer: BC Managed Care – PPO | Admitting: Psychology

## 2012-10-08 ENCOUNTER — Ambulatory Visit (INDEPENDENT_AMBULATORY_CARE_PROVIDER_SITE_OTHER): Payer: BC Managed Care – PPO | Admitting: Psychology

## 2012-10-08 DIAGNOSIS — F411 Generalized anxiety disorder: Secondary | ICD-10-CM

## 2012-10-09 ENCOUNTER — Ambulatory Visit (INDEPENDENT_AMBULATORY_CARE_PROVIDER_SITE_OTHER): Payer: BC Managed Care – PPO | Admitting: Pulmonary Disease

## 2012-10-09 ENCOUNTER — Encounter: Payer: Self-pay | Admitting: Pulmonary Disease

## 2012-10-09 VITALS — BP 144/96 | HR 74 | Temp 98.3°F | Ht 70.25 in | Wt 189.8 lb

## 2012-10-09 DIAGNOSIS — J4541 Moderate persistent asthma with (acute) exacerbation: Secondary | ICD-10-CM

## 2012-10-09 DIAGNOSIS — J45901 Unspecified asthma with (acute) exacerbation: Secondary | ICD-10-CM

## 2012-10-09 MED ORDER — PREDNISONE 10 MG PO TABS: ORAL_TABLET | ORAL | Status: DC

## 2012-10-09 NOTE — Assessment & Plan Note (Signed)
The patient is clearly having persistent airway inflammation and bronchospasm on exam after an acute viral illness.  He has maintained compliantly on his aggressive treatment regimen, but I suspect that he is going to need a steroid taper to get through this.  He is to followup with Dr.Byrum for his regular visits.

## 2012-10-09 NOTE — Progress Notes (Signed)
  Subjective:    Patient ID: [name], male    DOB: 1965/10/09, 47 y.o.   MRN: [id number]  HPI The patient comes in today for an acute sick visit.  He has known asthma with a significant allergy history, and is complaining of persistent cough as well as some dyspnea on exertion that is not usual for him.  The patient was doing very well on his current regimen and told he contracted a viral illness approximately 6 weeks ago.  He had significant chest congestion, cough, and nonpurulent mucus.  A lot of the symptoms have resolved, but he is continuing to have cough during any exertional activity, and is not maintaining his usual pace with his exercise regimen.  He denies any chest congestion or purulence.   Review of Systems  Constitutional: Positive for fatigue. Negative for fever and unexpected weight change.  HENT: Negative for ear pain, nosebleeds, congestion, sore throat, rhinorrhea, sneezing, trouble swallowing, dental problem, postnasal drip and sinus pressure.   Eyes: Negative for redness and itching.  Respiratory: Positive for cough, shortness of breath and wheezing. Negative for chest tightness.   Cardiovascular: Negative for palpitations and leg swelling.  Gastrointestinal: Negative for nausea and vomiting.  Genitourinary: Negative for dysuria.  Musculoskeletal: Negative for joint swelling.  Skin: Negative for rash.  Neurological: Positive for weakness. Negative for headaches.  Hematological: Does not bruise/bleed easily.  Psychiatric/Behavioral: Negative for dysphoric mood. The patient is not nervous/anxious.        Objective:   Physical Exam Well male in no acute distress Nose without purulence or discharge noted Oropharynx clear Neck without lymphadenopathy or thyromegaly Chest with mild upper airway wheezing that clears with pursed lip breathing, but significant lower airway wheezing and pops. Cardiac exam with regular rate and rhythm Lower extremities without edema,  cyanosis Alert and oriented, moves all 4 extremities.       Assessment & Plan:

## 2012-10-09 NOTE — Patient Instructions (Addendum)
Will treat you with an 8 day taper of steroids to reduce airway inflammation Can use albuterol before exercise for next 4-5 days if needed until inflammation improves.  However, you would probably be best served by limiting exercise for next few days. followup with Dr Delton Coombes as scheduled.

## 2012-10-12 ENCOUNTER — Telehealth: Payer: Self-pay | Admitting: Emergency Medicine

## 2012-10-12 NOTE — Telephone Encounter (Signed)
The most likely thing going on here is that your residual wheeze (that didn't respond to pred) is upper airway noise, while your asthma did respond (as it almost always does). I doubt that another round of prednisone will change it very much. He should continue decongestants, allergy regimen. He is correct that the prednisone may have caused him to be more tachycardic during exercise. If his wheeze continues he should be seen  - this didn;t respond to treatment like simple asthma would have.

## 2012-10-12 NOTE — Telephone Encounter (Signed)
I spoke with pt. He saw KC on 6/26. He stated his wheezing is actually worse. He finished the pred taper. He is wanting to know if he should take another round? He is taking mucinex DM. Should he keep taking this? Also he did some distance cycling last night and his HR got up to 194 ( ever been this high). Could this be from the prednisone? Pt does not believe he has an infection.  Please advise RB thanks  Allergies  Allergen Reactions  . Doxycycline   . Tetracycline

## 2012-10-12 NOTE — Telephone Encounter (Signed)
I spoke with pt and is aware of RB response. He voiced his understanding and needed nothing further.

## 2012-10-22 ENCOUNTER — Ambulatory Visit (INDEPENDENT_AMBULATORY_CARE_PROVIDER_SITE_OTHER): Payer: BC Managed Care – PPO | Admitting: Psychology

## 2012-10-22 DIAGNOSIS — F411 Generalized anxiety disorder: Secondary | ICD-10-CM

## 2012-11-05 ENCOUNTER — Ambulatory Visit: Payer: BC Managed Care – PPO | Admitting: Psychology

## 2012-11-19 ENCOUNTER — Ambulatory Visit: Payer: BC Managed Care – PPO | Admitting: Psychology

## 2012-12-03 ENCOUNTER — Ambulatory Visit (INDEPENDENT_AMBULATORY_CARE_PROVIDER_SITE_OTHER): Payer: BC Managed Care – PPO | Admitting: Psychology

## 2012-12-03 DIAGNOSIS — F411 Generalized anxiety disorder: Secondary | ICD-10-CM

## 2012-12-31 ENCOUNTER — Ambulatory Visit: Payer: BC Managed Care – PPO | Admitting: Psychology

## 2013-01-14 ENCOUNTER — Ambulatory Visit (INDEPENDENT_AMBULATORY_CARE_PROVIDER_SITE_OTHER): Payer: BC Managed Care – PPO | Admitting: Psychology

## 2013-01-14 DIAGNOSIS — F411 Generalized anxiety disorder: Secondary | ICD-10-CM

## 2013-02-11 ENCOUNTER — Ambulatory Visit (INDEPENDENT_AMBULATORY_CARE_PROVIDER_SITE_OTHER): Payer: BC Managed Care – PPO | Admitting: Psychology

## 2013-02-11 DIAGNOSIS — F411 Generalized anxiety disorder: Secondary | ICD-10-CM

## 2013-02-25 ENCOUNTER — Ambulatory Visit: Payer: BC Managed Care – PPO | Admitting: Psychology

## 2013-03-26 ENCOUNTER — Ambulatory Visit (INDEPENDENT_AMBULATORY_CARE_PROVIDER_SITE_OTHER): Payer: BC Managed Care – PPO | Admitting: Psychology

## 2013-03-26 DIAGNOSIS — F411 Generalized anxiety disorder: Secondary | ICD-10-CM

## 2013-04-03 ENCOUNTER — Other Ambulatory Visit: Payer: Self-pay | Admitting: Nurse Practitioner

## 2013-04-03 ENCOUNTER — Ambulatory Visit
Admission: RE | Admit: 2013-04-03 | Discharge: 2013-04-03 | Disposition: A | Discharge status: Home / Self Care | Payer: BC Managed Care – PPO | Source: Ambulatory Visit | Attending: Nurse Practitioner | Admitting: Nurse Practitioner

## 2013-04-03 DIAGNOSIS — J069 Acute upper respiratory infection, unspecified: Secondary | ICD-10-CM

## 2013-04-08 ENCOUNTER — Ambulatory Visit: Payer: BC Managed Care – PPO | Admitting: Psychology

## 2013-05-06 ENCOUNTER — Ambulatory Visit: Payer: BC Managed Care – PPO | Admitting: Psychology

## 2013-05-10 ENCOUNTER — Ambulatory Visit: Payer: BC Managed Care – PPO | Admitting: Psychology

## 2013-05-20 ENCOUNTER — Ambulatory Visit (INDEPENDENT_AMBULATORY_CARE_PROVIDER_SITE_OTHER): Payer: BC Managed Care – PPO | Admitting: Psychology

## 2013-05-20 DIAGNOSIS — F411 Generalized anxiety disorder: Secondary | ICD-10-CM

## 2013-05-23 ENCOUNTER — Encounter: Payer: Self-pay | Admitting: Adult Health

## 2013-05-23 ENCOUNTER — Encounter (INDEPENDENT_AMBULATORY_CARE_PROVIDER_SITE_OTHER): Payer: Self-pay

## 2013-05-23 ENCOUNTER — Ambulatory Visit (INDEPENDENT_AMBULATORY_CARE_PROVIDER_SITE_OTHER): Payer: BC Managed Care – PPO | Admitting: Adult Health

## 2013-05-23 VITALS — BP 150/100 | HR 78 | Ht 70.0 in | Wt 196.0 lb

## 2013-05-23 DIAGNOSIS — R05 Cough: Secondary | ICD-10-CM

## 2013-05-23 DIAGNOSIS — R053 Chronic cough: Secondary | ICD-10-CM

## 2013-05-23 DIAGNOSIS — J45909 Unspecified asthma, uncomplicated: Secondary | ICD-10-CM

## 2013-05-23 DIAGNOSIS — R059 Cough, unspecified: Secondary | ICD-10-CM

## 2013-05-23 NOTE — Progress Notes (Signed)
Subjective:    Patient ID: [name], male    DOB: December 20, 1965, 48 y.o.   MRN: [id number]  Asthma His past medical history is significant for asthma.   48 yo never smoker, with a hx of allergic rhinitis (off immunotherapy x 1 year), GERD, HTN (on benazepril/HCTZ until 11/10/11), hyperlipidemia, anxiety.   01/19/12 Follow up  Returns for follow up Still having some dry cough, small amounts of clear mucus, gurgling in right lung Of note he is back on ACE inhibitor. Does not like ARB causes weakness Have encouraged him that this will aggravate his cough and asthma.  Needs to remain off and discuss with PCP alternative b/p rx.  Road in 100 mile bike ride few days ago. Cough first few minutes then fine.  Feels better when he is exercising. But has daily cough and rattle in chest . No discolored mucus or fever . No edema.  PFT last ov w/ nml FEV1.     05/23/13 Acute OV  Complains of still having cough, wheezing for last 3 months and worsening over the past 2-3 weeks (fatigue) Has been treated by his family doctor on multiple visits with steroids . Feels better for a while then wheezing and cough come back . Pt exercises most days, rides his bike on competitive basis . Cough is worse with riding his bike esp in cold weather.  Has had extensive workup in past with nml PFT , low IGE , rast panel , and CT sinus .  He is on aggressive regimen with symbicort , nasal steroids/antihistamine and PPI for GERD/ and singulair.  Still has cough most days with wheezing.  At times coughs up clear mucus.  No fever, chest pain, orthopnea , edema , hemopytsis, wt loss, abd pain or fever.  Has been on ACE but is off for long time due to cough.  bp is elevated today.  , says he was seen by PCP few days ago and is working on b/p .  We discussed triggers of cough such as cold air  And discussed dangers of recurrent  Steroid use.  Says that his breathing is fine with extreme exercise-no shortness of breath,  lungs feel clear but has a cough often.       Past Medical History: (previous pulm  pt of Dr. [name] )  1. ASTHMA (ICD-493.90)--IGE 16.3, neg rast 04/2006, CT sinus neg 12/07  spirometry 3/06--FEV1 4.05 (90%), ratio 83%, CXR 8/08 neg.  2. Allergic Rhintis--prev. eval w/ Dr. Nira Retort [place] prev. on allergy vaccines.  3. HTN-controlled on rx amlodipine/benazapril- stopped July 07, 2008 -ACE. changed to amlodipine 5mg  and HCTZ 12.5mg   4. Hyperlipdiemia-TC 249/LDL 160/TG 172/HDL 34 --exercising, wt loss/diet and niacin 1000mg  /Fish oil at bedtime (10/07)  3. GERD  4. Anal fissure 12/90- Dr. Jarold Motto    Review of Systems  Constitutional:   No  weight loss, night sweats,  Fevers, chills, fatigue, or  lassitude.  HEENT:   No headaches,  Difficulty swallowing,  Tooth/dental problems, or  Sore throat,                No sneezing, itching, ear ache,  +nasal congestion, post nasal drip,   CV:  No chest pain,  Orthopnea, PND, swelling in lower extremities, anasarca, dizziness, palpitations, syncope.   GI  No heartburn, indigestion, abdominal pain, nausea, vomiting, diarrhea, change in bowel habits, loss of appetite, bloody stools.   Resp:    No coughing up of blood.  No change in  color of mucus.     No chest wall deformity  Skin: no rash or lesions.  GU: no dysuria, change in color of urine, no urgency or frequency.  No flank pain, no hematuria   MS:  No joint pain or swelling.  No decreased range of motion.  No back pain.  Psych:  No change in mood or affect. No depression or anxiety.  No memory loss.         Objective:   Physical Exam  GEN: A/Ox3; pleasant , NAD, well nourished   HEENT:  [place]/AT,  EACs-clear, TMs-wnl, NOSE-clear, THROAT-clear, no lesions, no postnasal drip or exudate noted.   NECK:  Supple w/ fair ROM; no JVD; normal carotid impulses w/o bruits; no thyromegaly or nodules palpated; no lymphadenopathy.  RESP  Coarse BS w/ few tr wheezes  -no accessory muscle use,  no dullness to percussion  CARD:  RRR, no m/r/g  , no peripheral edema, pulses intact, no cyanosis or clubbing.  GI:   Soft & nt; nml bowel sounds; no organomegaly or masses detected.  Musco: Warm bil, no deformities or joint swelling noted.   Neuro: alert, no focal deficits noted.    Skin: Warm, no lesions or rashes         Assessment & Plan:

## 2013-05-23 NOTE — Assessment & Plan Note (Signed)
?  Asthma vs RAD +/- cyclical cough  Pt has had an extensive workup that has been unrevealing.  Has normal PFT w/ no airflow obstruction in past.  Now off ACE w/ persistent cough .  He is an extreme athlete -suspect cough air is trigger for his cough at present time .  Will continue trigger prevention with GERD/Rhinitis  Change symbicort to [facility]   ? May need to consider stopping MDI as may be causing upper airway cough ?  Will hold on steroids at this time as dangers of chronic steroid use in pt so young. And athletic fear of fx risk.  Will check CT chest to make sure nothing missed on plain film- all cxr have been clear.  Check labs with cbc w/ diff , ESR and BNP    Plan  Change Symbicort  To Dulera 100 2 puffs Twice daily  -brush/rinse /gargle after use.  Continue on Protonix $RemoveBef'40mg'NXeUjxOkVl$  daily  Add Pepcid $RemoveBe'20mg'DrqfLXAce$  At bedtime   Add Chlor trimeton 4 mg 2 At bedtime  Drainage.  Continue on nasal sprays  Continue on Zyrtec daily  Mucinex DM Twice daily  For cough  May use Tessalon Three times a day  For cough As needed   We are setting you up for a CT chest  Labs today .  Follow up Dr. Lamonte Sakai  In 1 month and As needed

## 2013-05-23 NOTE — Patient Instructions (Signed)
Change Symbicort  To Dulera 100 2 puffs Twice daily  -brush/rinse /gargle after use.  Continue on Protonix 40mg  daily  Add Pepcid 20mg  At bedtime   Add Chlor trimeton 4 mg 2 At bedtime  Drainage.  Continue on nasal sprays  Continue on Zyrtec daily  Mucinex DM Twice daily  For cough  May use Tessalon Three times a day  For cough As needed   We are setting you up for a CT chest  Labs today .  Follow up Dr. Delton CoombesByrum  In 1 month and As needed

## 2013-05-24 ENCOUNTER — Other Ambulatory Visit (INDEPENDENT_AMBULATORY_CARE_PROVIDER_SITE_OTHER): Payer: BC Managed Care – PPO

## 2013-05-24 DIAGNOSIS — R053 Chronic cough: Secondary | ICD-10-CM

## 2013-05-24 DIAGNOSIS — R059 Cough, unspecified: Secondary | ICD-10-CM

## 2013-05-24 DIAGNOSIS — R05 Cough: Secondary | ICD-10-CM

## 2013-05-24 LAB — CBC WITH DIFFERENTIAL/PLATELET
Basophils Absolute: 0 10*3/uL (ref 0.0–0.1)
Basophils Relative: 0.4 % (ref 0.0–3.0)
EOS ABS: 0.2 10*3/uL (ref 0.0–0.7)
EOS PCT: 2.4 % (ref 0.0–5.0)
HCT: 44 % (ref 39.0–52.0)
HEMOGLOBIN: 15 g/dL (ref 13.0–17.0)
Lymphocytes Relative: 32 % (ref 12.0–46.0)
Lymphs Abs: 2.3 10*3/uL (ref 0.7–4.0)
MCHC: 34 g/dL (ref 30.0–36.0)
MCV: 92.6 fl (ref 78.0–100.0)
MONOS PCT: 11 % (ref 3.0–12.0)
Monocytes Absolute: 0.8 10*3/uL (ref 0.1–1.0)
NEUTROS ABS: 3.9 10*3/uL (ref 1.4–7.7)
Neutrophils Relative %: 54.2 % (ref 43.0–77.0)
PLATELETS: 207 10*3/uL (ref 150.0–400.0)
RBC: 4.75 Mil/uL (ref 4.22–5.81)
RDW: 13 % (ref 11.5–14.6)
WBC: 7.1 10*3/uL (ref 4.5–10.5)

## 2013-05-24 LAB — BRAIN NATRIURETIC PEPTIDE: PRO B NATRI PEPTIDE: 27 pg/mL (ref 0.0–100.0)

## 2013-05-24 LAB — SEDIMENTATION RATE: Sed Rate: 15 mm/hr (ref 0–22)

## 2013-05-29 ENCOUNTER — Ambulatory Visit (INDEPENDENT_AMBULATORY_CARE_PROVIDER_SITE_OTHER)
Admission: RE | Admit: 2013-05-29 | Discharge: 2013-05-29 | Disposition: A | Discharge status: Home / Self Care | Payer: BC Managed Care – PPO | Source: Ambulatory Visit | Attending: Adult Health | Admitting: Adult Health

## 2013-05-29 DIAGNOSIS — R053 Chronic cough: Secondary | ICD-10-CM

## 2013-05-29 DIAGNOSIS — R059 Cough, unspecified: Secondary | ICD-10-CM

## 2013-05-29 DIAGNOSIS — R05 Cough: Secondary | ICD-10-CM

## 2013-05-29 MED ORDER — IOHEXOL 300 MG/ML  SOLN
80.0000 mL | Freq: Once | INTRAMUSCULAR | Status: AC | PRN
  Administered 2013-05-29: 80 mL via INTRAVENOUS

## 2013-05-30 ENCOUNTER — Telehealth: Payer: Self-pay

## 2013-05-30 ENCOUNTER — Encounter: Payer: Self-pay | Admitting: Adult Health

## 2013-05-30 NOTE — Telephone Encounter (Signed)
Spoke with pt, he is aware of results and recs.  Nothing further needed. 

## 2013-05-30 NOTE — Telephone Encounter (Signed)
Message copied by Velvet BatheAULFIELD, ASHLEY L on Thu May 30, 2013 11:58 AM ------      Message from: Julio SicksPARRETT, TAMMY S      Created: Thu May 30, 2013 11:15 AM       Patchy areas of inflammation /congestion on CT - ? Recent bronchitis       No sign of PNA, masses/tumor or acute process.       Cont w/ ov recs       follow up Dr. Delton CoombesByrum  In 2 weeks to discuss in detail       Please contact office for sooner follow up if symptoms do not improve or worsen or seek emergency care        ------

## 2013-06-03 ENCOUNTER — Ambulatory Visit (INDEPENDENT_AMBULATORY_CARE_PROVIDER_SITE_OTHER): Payer: BC Managed Care – PPO | Admitting: Psychology

## 2013-06-03 DIAGNOSIS — F411 Generalized anxiety disorder: Secondary | ICD-10-CM

## 2013-06-17 ENCOUNTER — Ambulatory Visit (INDEPENDENT_AMBULATORY_CARE_PROVIDER_SITE_OTHER): Payer: BC Managed Care – PPO | Admitting: Psychology

## 2013-06-17 DIAGNOSIS — F411 Generalized anxiety disorder: Secondary | ICD-10-CM

## 2013-06-18 ENCOUNTER — Ambulatory Visit: Payer: BC Managed Care – PPO | Admitting: Emergency Medicine

## 2013-07-01 ENCOUNTER — Encounter: Payer: Self-pay | Admitting: Emergency Medicine

## 2013-07-01 ENCOUNTER — Ambulatory Visit (INDEPENDENT_AMBULATORY_CARE_PROVIDER_SITE_OTHER): Payer: BC Managed Care – PPO | Admitting: Psychology

## 2013-07-01 ENCOUNTER — Ambulatory Visit (INDEPENDENT_AMBULATORY_CARE_PROVIDER_SITE_OTHER): Payer: BC Managed Care – PPO | Admitting: Emergency Medicine

## 2013-07-01 VITALS — BP 140/88 | HR 71 | Ht 70.0 in | Wt 195.0 lb

## 2013-07-01 DIAGNOSIS — F411 Generalized anxiety disorder: Secondary | ICD-10-CM

## 2013-07-01 DIAGNOSIS — J45909 Unspecified asthma, uncomplicated: Secondary | ICD-10-CM

## 2013-07-01 NOTE — Progress Notes (Signed)
Subjective:    Patient ID: [name], male    DOB: 08-28-1965, 48 y.o.   MRN: [id number]  Asthma His past medical history is significant for asthma.   48 yo never smoker, with a hx of allergic rhinitis (off immunotherapy x 1 year), GERD, HTN (on benazepril/HCTZ until 11/10/11), hyperlipidemia, anxiety.   01/19/12 Follow up  Returns for follow up Still having some dry cough, small amounts of clear mucus, gurgling in right lung Of note he is back on ACE inhibitor. Does not like ARB causes weakness Have encouraged him that this will aggravate his cough and asthma.  Needs to remain off and discuss with PCP alternative b/p rx.  Road in 100 mile bike ride few days ago. Cough first few minutes then fine.  Feels better when he is exercising. But has daily cough and rattle in chest . No discolored mucus or fever . No edema.  PFT last ov w/ nml FEV1.    05/23/13 Acute OV  Complains of still having cough, wheezing for last 3 months and worsening over the past 2-3 weeks (fatigue) Has been treated by his family doctor on multiple visits with steroids . Feels better for a while then wheezing and cough come back . Pt exercises most days, rides his bike on competitive basis . Cough is worse with riding his bike esp in cold weather.  Has had extensive workup in past with nml PFT , low IGE , rast panel , and CT sinus .  He is on aggressive regimen with symbicort , nasal steroids/antihistamine and PPI for GERD/ and singulair.  Still has cough most days with wheezing.  At times coughs up clear mucus.  No fever, chest pain, orthopnea , edema , hemopytsis, wt loss, abd pain or fever.  Has been on ACE but is off for long time due to cough.  bp is elevated today.  , says he was seen by PCP few days ago and is working on b/p .  We discussed triggers of cough such as cold air  And discussed dangers of recurrent  Steroid use.  Says that his breathing is fine with extreme exercise-no shortness of breath, lungs  feel clear but has a cough often.   ROV 07/01/13 -- allergic rhinitis, cough, negative asthma workup but has been on Symbicort, now on Dulera,  singulair in addition to PND and GERD therapy.  He is currently on Pred + azithro.  He is on high dose nexium.     Past Medical History: (previous pulm  pt of Dr. [name] )  1. ASTHMA (ICD-493.90)--IGE 16.3, neg rast 04/2006, CT sinus neg 12/07  spirometry 3/06--FEV1 4.05 (90%), ratio 83%, CXR 8/08 neg.  2. Allergic Rhintis--prev. eval w/ Dr. Nira Retort [place] prev. on allergy vaccines.  3. HTN-controlled on rx amlodipine/benazapril- stopped July 07, 2008 -ACE. changed to amlodipine 5mg  and HCTZ 12.5mg   4. Hyperlipdiemia-TC 249/LDL 160/TG 172/HDL 34 --exercising, wt loss/diet and niacin 1000mg  /Fish oil at bedtime (10/07)  3. GERD  4. Anal fissure 12/90- Dr. Jarold Motto    Review of Systems Constitutional:   No  weight loss, night sweats,  Fevers, chills, fatigue, or  lassitude.  HEENT:   No headaches,  Difficulty swallowing,  Tooth/dental problems, or  Sore throat,                No sneezing, itching, ear ache,  +nasal congestion, post nasal drip,   CV:  No chest pain,  Orthopnea, PND, swelling in lower extremities, anasarca,  dizziness, palpitations, syncope.   GI  No heartburn, indigestion, abdominal pain, nausea, vomiting, diarrhea, change in bowel habits, loss of appetite, bloody stools.   Resp:    No coughing up of blood.  No change in color of mucus.     No chest wall deformity  Skin: no rash or lesions.  GU: no dysuria, change in color of urine, no urgency or frequency.  No flank pain, no hematuria   MS:  No joint pain or swelling.  No decreased range of motion.  No back pain.  Psych:  No change in mood or affect. No depression or anxiety.  No memory loss.       Objective:   Physical Exam Filed Vitals:   07/01/13 1615  BP: 140/88  Pulse: 71  Height: 5\' 10"  (1.778 m)  Weight: 195 lb (88.451 kg)  SpO2: 95%    GEN: A/Ox3; pleasant  , NAD, well nourished   HEENT:  [place]/AT,  EACs-clear, TMs-wnl, NOSE-clear, THROAT-clear, no lesions, no postnasal drip or exudate noted.   NECK:  Supple w/ fair ROM; no JVD; normal carotid impulses w/o bruits; no thyromegaly or nodules palpated; no lymphadenopathy.  RESP  Coarse BS w/ few tr wheezes  -no accessory muscle use, no dullness to percussion  CARD:  RRR, no m/r/g  , no peripheral edema, pulses intact, no cyanosis or clubbing.  GI:   Soft & nt; nml bowel sounds; no organomegaly or masses detected.  Musco: Warm bil, no deformities or joint swelling noted.   Neuro: alert, no focal deficits noted.    Skin: Warm, no lesions or rashes   CT chest 05/30/13 --  COMPARISON: None.  FINDINGS:  The chest wall is unremarkable. Scattered axillary lymph nodes are  noted bilaterally. No supraclavicular mass or adenopathy. The  thyroid gland appears normal. The bony thorax is intact.  The heart is normal in size. No pericardial effusion. No mediastinal  or hilar mass or adenopathy. The aorta is normal. The esophagus is  grossly normal.  Examination of the lung parenchyma demonstrates a few patchy areas  of subsegmental atelectasis and a few patchy areas of ground-glass  opacity. No focal airspace consolidation, pulmonary nodule or  pleural effusion. No findings for interstitial lung disease or  bronchiectasis. No worrisome pulmonary lesions.  The upper abdomen is unremarkable.  IMPRESSION:  Patchy areas of subsegmental atelectasis and minimal patchy areas  ground-glass opacity which could reflect mild inflammation or  alveolitis but no airspace consolidation, mass or interstitial lung  disease.  No mediastinal or hilar mass or adenopathy.       Assessment & Plan:  ASTHMA Spirometry completely normal in 2013. ? Whether his CT chest 2/'15 shows evidence for subtle alveolitis.  - will set up a methacholine challenge  - if unrevealing then consider FOB - next availavle after  testing

## 2013-07-01 NOTE — Assessment & Plan Note (Signed)
Spirometry completely normal in 2013. ? Whether his CT chest 2/'15 shows evidence for subtle alveolitis.  - will set up a methacholine challenge  - if unrevealing then consider FOB - next availavle after testing

## 2013-07-01 NOTE — Patient Instructions (Signed)
Please continue your current medications We will set up methacholine challenge AFTER you are finished with your prednisone. Stop your symbicort for 2 days prior to the test. Follow with Dr Delton CoombesByrum next available after your test is performed.

## 2013-07-02 ENCOUNTER — Other Ambulatory Visit: Payer: Self-pay | Admitting: Emergency Medicine

## 2013-07-02 DIAGNOSIS — J45909 Unspecified asthma, uncomplicated: Secondary | ICD-10-CM

## 2013-07-08 ENCOUNTER — Ambulatory Visit (HOSPITAL_COMMUNITY)
Admission: RE | Admit: 2013-07-08 | Discharge: 2013-07-08 | Disposition: A | Discharge status: Home / Self Care | Payer: BC Managed Care – PPO | Source: Ambulatory Visit | Attending: Emergency Medicine | Admitting: Emergency Medicine

## 2013-07-08 ENCOUNTER — Telehealth: Payer: Self-pay | Admitting: Emergency Medicine

## 2013-07-08 DIAGNOSIS — J45909 Unspecified asthma, uncomplicated: Secondary | ICD-10-CM | POA: Insufficient documentation

## 2013-07-08 LAB — PULMONARY FUNCTION TEST
FEF 25-75 POST: 3.38 L/s
FEF 25-75 PRE: 3.37 L/s
FEF2575-%Change-Post: 0 %
FEF2575-%PRED-POST: 95 %
FEF2575-%PRED-PRE: 94 %
FEV1-%Change-Post: 0 %
FEV1-%PRED-POST: 98 %
FEV1-%Pred-Pre: 98 %
FEV1-POST: 3.89 L
FEV1-Pre: 3.88 L
FEV1FVC-%Change-Post: 0 %
FEV1FVC-%Pred-Pre: 99 %
FEV6-%Change-Post: 0 %
FEV6-%PRED-POST: 101 %
FEV6-%PRED-PRE: 102 %
FEV6-POST: 4.95 L
FEV6-PRE: 4.97 L
FEV6FVC-%Change-Post: 0 %
FEV6FVC-%Pred-Post: 101 %
FEV6FVC-%Pred-Pre: 102 %
FVC-%Change-Post: 0 %
FVC-%PRED-PRE: 99 %
FVC-%Pred-Post: 99 %
FVC-Post: 5.01 L
FVC-Pre: 5 L
POST FEV1/FVC RATIO: 78 %
POST FEV6/FVC RATIO: 99 %
PRE FEV1/FVC RATIO: 78 %
PRE FEV6/FVC RATIO: 99 %

## 2013-07-08 MED ORDER — ALBUTEROL SULFATE (2.5 MG/3ML) 0.083% IN NEBU
2.5000 mg | INHALATION_SOLUTION | Freq: Once | RESPIRATORY_TRACT | Status: AC
Start: 2013-07-08 — End: 2013-07-08
  Administered 2013-07-08: 2.5 mg via RESPIRATORY_TRACT

## 2013-07-08 MED ORDER — METHACHOLINE 0.0625 MG/ML NEB SOLN
2.0000 mL | Freq: Once | RESPIRATORY_TRACT | Status: AC
  Administered 2013-07-08: 0.125 mg via RESPIRATORY_TRACT

## 2013-07-08 MED ORDER — METHACHOLINE 16 MG/ML NEB SOLN
2.0000 mL | Freq: Once | RESPIRATORY_TRACT | Status: AC
  Administered 2013-07-08: 32 mg via RESPIRATORY_TRACT

## 2013-07-08 MED ORDER — METHACHOLINE 0.25 MG/ML NEB SOLN
2.0000 mL | Freq: Once | RESPIRATORY_TRACT | Status: AC
  Administered 2013-07-08: 0.5 mg via RESPIRATORY_TRACT

## 2013-07-08 MED ORDER — METHACHOLINE 1 MG/ML NEB SOLN
2.0000 mL | Freq: Once | RESPIRATORY_TRACT | Status: AC
  Administered 2013-07-08: 2 mg via RESPIRATORY_TRACT

## 2013-07-08 MED ORDER — SODIUM CHLORIDE 0.9 % IN NEBU
3.0000 mL | INHALATION_SOLUTION | Freq: Once | RESPIRATORY_TRACT | Status: AC
  Administered 2013-07-08: 3 mL via RESPIRATORY_TRACT

## 2013-07-08 MED ORDER — METHACHOLINE 4 MG/ML NEB SOLN
2.0000 mL | Freq: Once | RESPIRATORY_TRACT | Status: AC
  Administered 2013-07-08: 8 mg via RESPIRATORY_TRACT

## 2013-07-08 NOTE — Telephone Encounter (Signed)
Advised pt to continue symbicort until discussed with RB at next ov-meth challenge done and had stopped two days prior and will now resume

## 2013-07-09 ENCOUNTER — Encounter: Payer: Self-pay | Admitting: Emergency Medicine

## 2013-07-09 ENCOUNTER — Ambulatory Visit (INDEPENDENT_AMBULATORY_CARE_PROVIDER_SITE_OTHER): Payer: BC Managed Care – PPO | Admitting: Emergency Medicine

## 2013-07-09 VITALS — BP 140/94 | HR 91 | Ht 70.0 in | Wt 196.0 lb

## 2013-07-09 DIAGNOSIS — J45909 Unspecified asthma, uncomplicated: Secondary | ICD-10-CM

## 2013-07-09 MED ORDER — AZELASTINE HCL 0.1 % NA SOLN: 1.0000 | Freq: Three times a day (TID) | NASAL | Status: AC

## 2013-07-09 NOTE — Progress Notes (Signed)
Subjective:    Patient ID: [name], male    DOB: Oct 21, 1965, 48 y.o.   MRN: [id number]  HPI 48 yo never smoker, with a hx of allergic rhinitis (off immunotherapy x 1 year), GERD, HTN (on benazepril/HCTZ until 11/10/11), hyperlipidemia, anxiety.   12/15/11 Pulmonary consult with Dr. Delton CoombesByrum  (prev. pulm pt of Dr. Kriste BasqueNadel  )  Reports a hx of lifelong asthma, first dx as a child. He has had spirometry, but doesn't recall full PFT. He is currently on Symbicort, Singulair, albuterol prn. His allergy regimen is . He is very active, cycles and is in very good shape. Over the last 2 years he has noticed continued dry cough, noise when he breathes (? Wheeze vs stridor). Has been on zyrtec for years, also on nasal steroid.  He omeprazole qd. Triggers - allergies, not exercise, perfumes, smoke, not air temperature.  >PPI increased Twice daily    01/19/12 Follow up  Returns for follow up Still having some dry cough, small amounts of clear mucus, gurgling in right lung Of note he is back on ACE inhibitor. Does not like ARB causes weakness Have encouraged him that this will aggravate his cough and asthma.  Needs to remain off and discuss with PCP alternative b/p rx.  Road in 100 mile bike ride few days ago. Cough first few minutes then fine.  Feels better when he is exercising. But has daily cough and rattle in chest . No discolored mucus or fever . No edema.  PFT last ov w/ nml FEV1  ROV 02/22/12 -- hx asthma, chronic cough, allergies. Currently on zyrtec, fluticasone, astelin, singulair, omeprazole, symbicort.  He is back on ACE-I, feels that the cough and UA irritation are minimal as long as he is on mucinex-dm, chlortrimaton. Asthma is better now that allergies are well controlled.   ROV 07/01/13 -- allergic rhinitis, cough, negative asthma workup but has been on Symbicort, now on Dulera, singulair in addition to PND and GERD therapy. He is currently on Pred + azithro. He is on high dose nexium.  ROV  07/09/13 -- follows up for possible asthma, chronic cough, allergies. He underwent methacholine 07/08/13 > negative for hyper-responsiveness. His spiro has been negative. He has a lot of manifestations that sound like UA irritation. Also with subclinical exercise-related mucous production and sometimes wheeze.    Past Medical History: (previous pulm  pt of Dr. Kriste BasqueNadel )  1. ASTHMA (ICD-493.90)--IGE 16.3, neg rast 04/2006, CT sinus neg 12/07  spirometry 3/06--FEV1 4.05 (90%), ratio 83%, CXR 8/08 neg.  2. Allergic Rhintis--prev. eval w/ Dr. Nira RetortGene Carver prev. on allergy vaccines.  3. HTN-controlled on rx amlodipine/benazapril- stopped July 07, 2008 -ACE. changed to amlodipine 5mg  and HCTZ 12.5mg   4. Hyperlipdiemia-TC 249/LDL 160/TG 172/HDL 34 --exercising, wt loss/diet and niacin 1000mg  /Fish oil at bedtime (10/07)  3. GERD  4. Anal fissure 12/90- Dr. Jarold MottoPatterson       Objective:   Physical Exam Filed Vitals:   07/09/13 1433  BP: 140/94  Pulse: 91   GEN: A/Ox3; pleasant , NAD, well nourished   HEENT:  /AT,  EACs-clear, TMs-wnl, NOSE-clear, THROAT-clear, no lesions, no postnasal drip or exudate noted.   NECK:  Supple w/ fair ROM; no JVD; normal carotid impulses w/o bruits; no thyromegaly or nodules palpated; no lymphadenopathy.  RESP  Coarse BS w/ few rhonchi -no accessory muscle use, no dullness to percussion  CARD:  RRR, no m/r/g  , no peripheral edema, pulses intact, no cyanosis or clubbing.  Musco: Warm bil, no deformities or joint swelling noted.   Neuro: alert, no focal deficits noted.    Skin: Warm, no lesions or rashes      Assessment & Plan:  No problem-specific assessment & plan notes found for this encounter.

## 2013-07-09 NOTE — Assessment & Plan Note (Signed)
Your methacholine challenge does not show evidence for asthma.  Continue your  Fluticasone nasal spray 2 sprays each nostril twice a day Continue chlorpheniramine at night Continue your astelin. You could consider increasing this to three times a day as needed Consider pre-treating your exercise with 2 puffs albuterol Follow with Dr Delton CoombesByrum as needed

## 2013-07-09 NOTE — Patient Instructions (Addendum)
Your methacholine challenge does not show evidence for asthma.  Continue your  Fluticasone nasal spray 2 sprays each nostril twice a day Continue chlorpheniramine at night Continue your astelin. You could consider increasing this to three times a day as needed Consider pre-treating your exercise with 2 puffs albuterol Stop symbicort for now Follow with Dr Delton CoombesByrum in 2 months.

## 2013-07-09 NOTE — Addendum Note (Signed)
Addended by: Gwynneth AlimentLAWRENCE, Deshawnda Acrey A on: 07/09/2013 03:23 PM   Modules accepted: Orders

## 2013-07-11 ENCOUNTER — Ambulatory Visit: Payer: BC Managed Care – PPO | Admitting: Pulmonary Disease

## 2013-07-29 ENCOUNTER — Ambulatory Visit: Payer: BC Managed Care – PPO | Admitting: Psychology

## 2013-08-12 ENCOUNTER — Ambulatory Visit (INDEPENDENT_AMBULATORY_CARE_PROVIDER_SITE_OTHER): Payer: BC Managed Care – PPO | Admitting: Psychology

## 2013-08-12 DIAGNOSIS — F411 Generalized anxiety disorder: Secondary | ICD-10-CM

## 2013-09-23 ENCOUNTER — Ambulatory Visit (INDEPENDENT_AMBULATORY_CARE_PROVIDER_SITE_OTHER): Payer: BC Managed Care – PPO | Admitting: Psychology

## 2013-09-23 DIAGNOSIS — F411 Generalized anxiety disorder: Secondary | ICD-10-CM

## 2013-10-07 ENCOUNTER — Ambulatory Visit: Payer: BC Managed Care – PPO | Admitting: Psychology

## 2013-10-21 ENCOUNTER — Ambulatory Visit: Payer: BC Managed Care – PPO | Admitting: Psychology

## 2013-11-04 ENCOUNTER — Ambulatory Visit: Payer: BC Managed Care – PPO | Admitting: Psychology

## 2013-11-11 ENCOUNTER — Ambulatory Visit (INDEPENDENT_AMBULATORY_CARE_PROVIDER_SITE_OTHER): Payer: BC Managed Care – PPO | Admitting: Psychology

## 2013-11-11 DIAGNOSIS — F411 Generalized anxiety disorder: Secondary | ICD-10-CM

## 2013-11-18 ENCOUNTER — Ambulatory Visit: Payer: BC Managed Care – PPO | Admitting: Psychology

## 2013-12-16 ENCOUNTER — Ambulatory Visit (INDEPENDENT_AMBULATORY_CARE_PROVIDER_SITE_OTHER): Payer: BC Managed Care – PPO | Admitting: Psychology

## 2013-12-16 DIAGNOSIS — F411 Generalized anxiety disorder: Secondary | ICD-10-CM

## 2014-01-13 ENCOUNTER — Ambulatory Visit (INDEPENDENT_AMBULATORY_CARE_PROVIDER_SITE_OTHER): Payer: BC Managed Care – PPO | Admitting: Psychology

## 2014-01-13 DIAGNOSIS — F411 Generalized anxiety disorder: Secondary | ICD-10-CM

## 2014-01-27 ENCOUNTER — Ambulatory Visit: Payer: BC Managed Care – PPO | Admitting: Psychology

## 2014-02-10 ENCOUNTER — Ambulatory Visit: Payer: BC Managed Care – PPO | Admitting: Psychology

## 2014-02-24 ENCOUNTER — Ambulatory Visit: Payer: BC Managed Care – PPO | Admitting: Psychology

## 2014-03-10 ENCOUNTER — Ambulatory Visit: Payer: BC Managed Care – PPO | Admitting: Psychology

## 2014-03-24 ENCOUNTER — Ambulatory Visit (INDEPENDENT_AMBULATORY_CARE_PROVIDER_SITE_OTHER): Payer: BC Managed Care – PPO | Admitting: Psychology

## 2014-03-24 DIAGNOSIS — F411 Generalized anxiety disorder: Secondary | ICD-10-CM

## 2014-04-07 ENCOUNTER — Ambulatory Visit: Payer: BC Managed Care – PPO | Admitting: Psychology

## 2014-04-21 ENCOUNTER — Ambulatory Visit (INDEPENDENT_AMBULATORY_CARE_PROVIDER_SITE_OTHER): Payer: BLUE CROSS/BLUE SHIELD | Admitting: Psychology

## 2014-04-21 DIAGNOSIS — F411 Generalized anxiety disorder: Secondary | ICD-10-CM

## 2014-04-24 ENCOUNTER — Other Ambulatory Visit: Payer: Self-pay | Admitting: Emergency Medicine

## 2014-05-05 ENCOUNTER — Ambulatory Visit: Payer: Self-pay | Admitting: Psychology

## 2014-05-19 ENCOUNTER — Ambulatory Visit: Payer: Self-pay | Admitting: Psychology

## 2014-06-02 ENCOUNTER — Ambulatory Visit: Payer: Self-pay | Admitting: Psychology

## 2014-06-16 ENCOUNTER — Ambulatory Visit: Payer: Self-pay | Admitting: Psychology

## 2014-06-30 ENCOUNTER — Ambulatory Visit (INDEPENDENT_AMBULATORY_CARE_PROVIDER_SITE_OTHER): Payer: BLUE CROSS/BLUE SHIELD | Admitting: Psychology

## 2014-06-30 DIAGNOSIS — F411 Generalized anxiety disorder: Secondary | ICD-10-CM

## 2014-07-14 ENCOUNTER — Ambulatory Visit: Payer: BLUE CROSS/BLUE SHIELD | Admitting: Psychology

## 2014-07-28 ENCOUNTER — Ambulatory Visit (INDEPENDENT_AMBULATORY_CARE_PROVIDER_SITE_OTHER): Payer: BLUE CROSS/BLUE SHIELD | Admitting: Psychology

## 2014-07-28 DIAGNOSIS — F411 Generalized anxiety disorder: Secondary | ICD-10-CM | POA: Diagnosis not present

## 2014-08-11 ENCOUNTER — Ambulatory Visit (INDEPENDENT_AMBULATORY_CARE_PROVIDER_SITE_OTHER): Payer: BLUE CROSS/BLUE SHIELD | Admitting: Psychology

## 2014-08-11 DIAGNOSIS — F411 Generalized anxiety disorder: Secondary | ICD-10-CM | POA: Diagnosis not present

## 2014-08-25 ENCOUNTER — Ambulatory Visit: Payer: BLUE CROSS/BLUE SHIELD | Admitting: Psychology

## 2014-09-08 ENCOUNTER — Ambulatory Visit: Payer: BLUE CROSS/BLUE SHIELD | Admitting: Psychology

## 2014-09-22 ENCOUNTER — Ambulatory Visit (INDEPENDENT_AMBULATORY_CARE_PROVIDER_SITE_OTHER): Payer: PRIVATE HEALTH INSURANCE | Admitting: Psychology

## 2014-09-22 DIAGNOSIS — F411 Generalized anxiety disorder: Secondary | ICD-10-CM

## 2014-10-06 ENCOUNTER — Ambulatory Visit (INDEPENDENT_AMBULATORY_CARE_PROVIDER_SITE_OTHER): Payer: PRIVATE HEALTH INSURANCE | Admitting: Psychology

## 2014-10-06 DIAGNOSIS — F411 Generalized anxiety disorder: Secondary | ICD-10-CM

## 2014-11-03 ENCOUNTER — Ambulatory Visit: Payer: BLUE CROSS/BLUE SHIELD | Admitting: Psychology

## 2014-11-17 ENCOUNTER — Ambulatory Visit (INDEPENDENT_AMBULATORY_CARE_PROVIDER_SITE_OTHER): Payer: PRIVATE HEALTH INSURANCE | Admitting: Psychology

## 2014-11-17 DIAGNOSIS — F411 Generalized anxiety disorder: Secondary | ICD-10-CM | POA: Diagnosis not present

## 2014-12-01 ENCOUNTER — Ambulatory Visit: Payer: BLUE CROSS/BLUE SHIELD | Admitting: Psychology

## 2014-12-15 ENCOUNTER — Ambulatory Visit: Payer: Self-pay | Admitting: Psychology

## 2014-12-29 ENCOUNTER — Ambulatory Visit (INDEPENDENT_AMBULATORY_CARE_PROVIDER_SITE_OTHER): Payer: PRIVATE HEALTH INSURANCE | Admitting: Psychology

## 2014-12-29 DIAGNOSIS — F411 Generalized anxiety disorder: Secondary | ICD-10-CM

## 2015-01-12 ENCOUNTER — Ambulatory Visit: Payer: BLUE CROSS/BLUE SHIELD | Admitting: Psychology

## 2015-01-26 ENCOUNTER — Ambulatory Visit (INDEPENDENT_AMBULATORY_CARE_PROVIDER_SITE_OTHER): Payer: PRIVATE HEALTH INSURANCE | Admitting: Psychology

## 2015-01-26 DIAGNOSIS — F411 Generalized anxiety disorder: Secondary | ICD-10-CM | POA: Diagnosis not present

## 2015-02-09 ENCOUNTER — Ambulatory Visit: Payer: BLUE CROSS/BLUE SHIELD | Admitting: Psychology

## 2015-03-09 ENCOUNTER — Ambulatory Visit: Payer: PRIVATE HEALTH INSURANCE | Admitting: Psychology

## 2015-03-10 ENCOUNTER — Ambulatory Visit (INDEPENDENT_AMBULATORY_CARE_PROVIDER_SITE_OTHER): Payer: PRIVATE HEALTH INSURANCE | Admitting: Psychology

## 2015-03-10 DIAGNOSIS — F411 Generalized anxiety disorder: Secondary | ICD-10-CM

## 2015-03-23 ENCOUNTER — Ambulatory Visit: Payer: PRIVATE HEALTH INSURANCE | Admitting: Psychology

## 2015-04-01 ENCOUNTER — Other Ambulatory Visit: Payer: Self-pay | Admitting: Sports Medicine

## 2015-04-01 DIAGNOSIS — M545 Low back pain: Secondary | ICD-10-CM

## 2015-04-06 ENCOUNTER — Ambulatory Visit: Payer: PRIVATE HEALTH INSURANCE | Admitting: Psychology

## 2015-04-08 ENCOUNTER — Other Ambulatory Visit: Payer: Self-pay

## 2015-04-09 ENCOUNTER — Other Ambulatory Visit: Payer: Self-pay

## 2015-04-09 ENCOUNTER — Ambulatory Visit
Admission: RE | Admit: 2015-04-09 | Discharge: 2015-04-09 | Disposition: A | Discharge status: Home / Self Care | Payer: PRIVATE HEALTH INSURANCE | Source: Ambulatory Visit | Attending: Sports Medicine | Admitting: Sports Medicine

## 2015-04-09 DIAGNOSIS — M545 Low back pain: Secondary | ICD-10-CM

## 2015-05-04 ENCOUNTER — Ambulatory Visit (INDEPENDENT_AMBULATORY_CARE_PROVIDER_SITE_OTHER): Payer: PRIVATE HEALTH INSURANCE | Admitting: Psychology

## 2015-05-04 DIAGNOSIS — F411 Generalized anxiety disorder: Secondary | ICD-10-CM | POA: Diagnosis not present

## 2015-05-18 ENCOUNTER — Ambulatory Visit: Payer: Self-pay | Admitting: Psychology

## 2015-06-19 ENCOUNTER — Ambulatory Visit: Payer: PRIVATE HEALTH INSURANCE | Admitting: Psychology

## 2015-07-03 ENCOUNTER — Ambulatory Visit: Payer: Self-pay | Admitting: Psychology

## 2015-07-17 ENCOUNTER — Ambulatory Visit: Payer: PRIVATE HEALTH INSURANCE | Admitting: Psychology

## 2015-07-31 ENCOUNTER — Ambulatory Visit: Payer: Self-pay | Admitting: Psychology

## 2015-08-28 ENCOUNTER — Ambulatory Visit (INDEPENDENT_AMBULATORY_CARE_PROVIDER_SITE_OTHER): Payer: 59 | Admitting: Psychology

## 2015-08-28 ENCOUNTER — Ambulatory Visit
Admission: RE | Admit: 2015-08-28 | Discharge: 2015-08-28 | Disposition: A | Discharge status: Home / Self Care | Payer: 59 | Source: Ambulatory Visit | Attending: Internal Medicine | Admitting: Internal Medicine

## 2015-08-28 ENCOUNTER — Other Ambulatory Visit: Payer: Self-pay | Admitting: Internal Medicine

## 2015-08-28 DIAGNOSIS — M542 Cervicalgia: Secondary | ICD-10-CM

## 2015-08-28 DIAGNOSIS — M546 Pain in thoracic spine: Secondary | ICD-10-CM

## 2015-08-28 DIAGNOSIS — F411 Generalized anxiety disorder: Secondary | ICD-10-CM | POA: Diagnosis not present

## 2015-09-25 ENCOUNTER — Ambulatory Visit: Payer: Self-pay | Admitting: Psychology

## 2015-10-09 ENCOUNTER — Ambulatory Visit: Payer: 59 | Admitting: Psychology

## 2015-11-06 ENCOUNTER — Ambulatory Visit (INDEPENDENT_AMBULATORY_CARE_PROVIDER_SITE_OTHER): Payer: 59 | Admitting: Psychology

## 2015-11-06 DIAGNOSIS — F411 Generalized anxiety disorder: Secondary | ICD-10-CM | POA: Diagnosis not present

## 2015-11-20 ENCOUNTER — Ambulatory Visit (INDEPENDENT_AMBULATORY_CARE_PROVIDER_SITE_OTHER): Payer: 59 | Admitting: Psychology

## 2015-11-20 DIAGNOSIS — F411 Generalized anxiety disorder: Secondary | ICD-10-CM

## 2015-12-04 ENCOUNTER — Ambulatory Visit: Payer: 59 | Admitting: Psychology

## 2015-12-18 ENCOUNTER — Ambulatory Visit: Payer: 59 | Admitting: Psychology

## 2016-01-01 ENCOUNTER — Ambulatory Visit: Payer: 59 | Admitting: Psychology

## 2016-01-15 ENCOUNTER — Ambulatory Visit: Payer: 59 | Admitting: Psychology

## 2016-01-25 ENCOUNTER — Ambulatory Visit (INDEPENDENT_AMBULATORY_CARE_PROVIDER_SITE_OTHER): Payer: 59 | Admitting: Psychology

## 2016-01-25 DIAGNOSIS — F411 Generalized anxiety disorder: Secondary | ICD-10-CM

## 2016-02-24 ENCOUNTER — Other Ambulatory Visit: Payer: Self-pay | Admitting: Gastroenterology

## 2016-03-09 ENCOUNTER — Other Ambulatory Visit: Payer: Self-pay | Admitting: Gastroenterology

## 2016-03-30 ENCOUNTER — Encounter (HOSPITAL_COMMUNITY): Payer: Self-pay

## 2016-04-05 ENCOUNTER — Encounter (HOSPITAL_COMMUNITY): Payer: Self-pay

## 2016-04-05 ENCOUNTER — Ambulatory Visit (HOSPITAL_COMMUNITY): Admit: 2016-04-05 | Payer: 59 | Admitting: Gastroenterology

## 2016-04-05 SURGERY — COLONOSCOPY WITH PROPOFOL
Anesthesia: Monitor Anesthesia Care

## 2016-04-29 ENCOUNTER — Other Ambulatory Visit: Payer: Self-pay | Admitting: Gastroenterology

## 2016-06-03 ENCOUNTER — Encounter (HOSPITAL_COMMUNITY): Payer: Self-pay | Admitting: *Deleted

## 2016-06-06 ENCOUNTER — Ambulatory Visit (HOSPITAL_COMMUNITY): Payer: 59 | Admitting: Anesthesiology

## 2016-06-06 ENCOUNTER — Ambulatory Visit (HOSPITAL_COMMUNITY)
Admission: RE | Admit: 2016-06-06 | Discharge: 2016-06-06 | Disposition: A | Discharge status: Home / Self Care | Payer: 59 | Source: Ambulatory Visit | Attending: Gastroenterology | Admitting: Gastroenterology

## 2016-06-06 ENCOUNTER — Encounter (HOSPITAL_COMMUNITY)
Admission: RE | Disposition: A | Discharge status: Home / Self Care | Payer: Self-pay | Source: Ambulatory Visit | Attending: Gastroenterology

## 2016-06-06 ENCOUNTER — Encounter (HOSPITAL_COMMUNITY): Payer: Self-pay

## 2016-06-06 DIAGNOSIS — Z1211 Encounter for screening for malignant neoplasm of colon: Secondary | ICD-10-CM | POA: Diagnosis not present

## 2016-06-06 DIAGNOSIS — F419 Anxiety disorder, unspecified: Secondary | ICD-10-CM | POA: Diagnosis not present

## 2016-06-06 DIAGNOSIS — E7800 Pure hypercholesterolemia, unspecified: Secondary | ICD-10-CM | POA: Insufficient documentation

## 2016-06-06 DIAGNOSIS — J45909 Unspecified asthma, uncomplicated: Secondary | ICD-10-CM | POA: Diagnosis not present

## 2016-06-06 DIAGNOSIS — M1990 Unspecified osteoarthritis, unspecified site: Secondary | ICD-10-CM | POA: Diagnosis not present

## 2016-06-06 DIAGNOSIS — K219 Gastro-esophageal reflux disease without esophagitis: Secondary | ICD-10-CM | POA: Diagnosis not present

## 2016-06-06 DIAGNOSIS — Z79899 Other long term (current) drug therapy: Secondary | ICD-10-CM | POA: Diagnosis not present

## 2016-06-06 DIAGNOSIS — I10 Essential (primary) hypertension: Secondary | ICD-10-CM | POA: Insufficient documentation

## 2016-06-06 HISTORY — PX: COLONOSCOPY WITH PROPOFOL: SHX5780

## 2016-06-06 SURGERY — COLONOSCOPY WITH PROPOFOL
Anesthesia: Monitor Anesthesia Care

## 2016-06-06 MED ORDER — PROPOFOL 10 MG/ML IV BOLUS
INTRAVENOUS | Status: AC
  Filled 2016-06-06: qty 60

## 2016-06-06 MED ORDER — PROPOFOL 500 MG/50ML IV EMUL
INTRAVENOUS | Status: DC | PRN
  Administered 2016-06-06: 150 ug/kg/min via INTRAVENOUS

## 2016-06-06 MED ORDER — PROPOFOL 10 MG/ML IV BOLUS
INTRAVENOUS | Status: DC | PRN
  Administered 2016-06-06: 30 mg via INTRAVENOUS
  Administered 2016-06-06 (×2): 20 mg via INTRAVENOUS
  Administered 2016-06-06: 30 mg via INTRAVENOUS

## 2016-06-06 MED ORDER — LIDOCAINE 2% (20 MG/ML) 5 ML SYRINGE
INTRAMUSCULAR | Status: AC
  Filled 2016-06-06: qty 5

## 2016-06-06 MED ORDER — LIDOCAINE 2% (20 MG/ML) 5 ML SYRINGE
INTRAMUSCULAR | Status: DC | PRN
  Administered 2016-06-06: 60 mg via INTRAVENOUS

## 2016-06-06 MED ORDER — LACTATED RINGERS IV SOLN
INTRAVENOUS | Status: DC
  Administered 2016-06-06: 1000 mL via INTRAVENOUS

## 2016-06-06 MED ORDER — SODIUM CHLORIDE 0.9 % IV SOLN: INTRAVENOUS | Status: DC

## 2016-06-06 SURGICAL SUPPLY — 21 items

## 2016-06-06 NOTE — H&P (Signed)
Procedure: Baseline screening colonoscopy  History: The patient is a 51 year old male born 10-May-1965. He is scheduled to undergo his first screening colonoscopy with polypectomy to prevent colon cancer.  Past medical history: Vasectomy. Umbilical hernia repair. Inguinal herniorrhaphy. Asthma. Hypertension. Gastroesophageal reflux. Anxiety. Hypercholesterolemia.  Medication allergies: Peanuts. Walnuts. Alupent caused palpitations  Exam: The patient is alert and lying comfortably on the endoscopy stretcher. Abdomen is soft and nontender to palpation. Lungs are clear to auscultation. Cardiac exam reveals a regular rhythm.  Plan: Proceed with screening colonoscopy

## 2016-06-06 NOTE — Anesthesia Postprocedure Evaluation (Addendum)
Anesthesia Post Note  Patient: [name]  Procedure(s) Performed: Procedure(s) (LRB): COLONOSCOPY WITH PROPOFOL (N/A)  Patient location during evaluation: PACU Anesthesia Type: MAC Level of consciousness: awake and alert Pain management: pain level controlled Vital Signs Assessment: post-procedure vital signs reviewed and stable Respiratory status: spontaneous breathing, nonlabored ventilation, respiratory function stable and patient connected to nasal cannula oxygen Cardiovascular status: stable and blood pressure returned to baseline Anesthetic complications: no       Last Vitals:  Vitals:   06/06/16 1111 06/06/16 1234  BP: (!) 159/101 129/81  Pulse: 66 60  Resp: 19 15  Temp: 36.7 C 36.8 C    Last Pain:  Vitals:   06/06/16 1234  TempSrc: Oral                 Effie Berkshire

## 2016-06-06 NOTE — Discharge Instructions (Signed)
Colonoscopy, Adult, Care After  This sheet gives you information about how to care for yourself after your procedure. Your health care provider may also give you more specific instructions. If you have problems or questions, contact your health care provider.  What can I expect after the procedure?  After the procedure, it is common to have:  · A small amount of blood in your stool for 24 hours after the procedure.  · Some gas.  · Mild abdominal cramping or bloating.    Follow these instructions at home:  General instructions     · For the first 24 hours after the procedure:  ? Do not drive or use machinery.  ? Do not sign important documents.  ? Do not drink alcohol.  ? Do your regular daily activities at a slower pace than normal.  ? Eat soft, easy-to-digest foods.  ? Rest often.  · Take over-the-counter or prescription medicines only as told by your health care provider.  · It is up to you to get the results of your procedure. Ask your health care provider, or the department performing the procedure, when your results will be ready.  Relieving cramping and bloating   · Try walking around when you have cramps or feel bloated.  · Apply heat to your abdomen as told by your health care provider. Use a heat source that your health care provider recommends, such as a moist heat pack or a heating pad.  ? Place a towel between your skin and the heat source.  ? Leave the heat on for 20-30 minutes.  ? Remove the heat if your skin turns bright red. This is especially important if you are unable to feel pain, heat, or cold. You may have a greater risk of getting burned.  Eating and drinking   · Drink enough fluid to keep your urine clear or pale yellow.  · Resume your normal diet as instructed by your health care provider. Avoid heavy or fried foods that are hard to digest.  · Avoid drinking alcohol for as long as instructed by your health care provider.  Contact a health care provider if:  · You have blood in your stool 2-3  days after the procedure.  Get help right away if:  · You have more than a small spotting of blood in your stool.  · You pass large blood clots in your stool.  · Your abdomen is swollen.  · You have nausea or vomiting.  · You have a fever.  · You have increasing abdominal pain that is not relieved with medicine.  This information is not intended to replace advice given to you by your health care provider. Make sure you discuss any questions you have with your health care provider.  Document Released: 11/17/2003 Document Revised: 12/28/2015 Document Reviewed: 06/16/2015  Elsevier Interactive Patient Education © 2017 [employer]

## 2016-06-06 NOTE — Op Note (Signed)
[facility] Patient Name: [name] Procedure Date: 06/06/2016 MRN: [id number] Attending MD: Charolett Bumpers , MD Date of Birth: February 18, 1966 CSN: [id number] Age: 50 Admit Type: Outpatient Procedure:                Colonoscopy Indications:              Screening for colorectal malignant neoplasm Providers:                Charolett Bumpers, MD, Weston Settle, RN, Beryle Beams, Technician, Delphia Grates, CRNA Referring MD:              Medicines:                Propofol per Anesthesia Complications:            No immediate complications. Estimated Blood Loss:     Estimated blood loss: none. Procedure:                Pre-Anesthesia Assessment:                           - Prior to the procedure, a History and Physical                            was performed, and patient medications and                            allergies were reviewed. The patient's tolerance of                            previous anesthesia was also reviewed. The risks                            and benefits of the procedure and the sedation                            options and risks were discussed with the patient.                            All questions were answered, and informed consent                            was obtained. Prior Anticoagulants: The patient has                            taken no previous anticoagulant or antiplatelet                            agents. ASA Grade Assessment: II - A patient with                            mild systemic disease. After reviewing the risks  and benefits, the patient was deemed in                            satisfactory condition to undergo the procedure.                           After obtaining informed consent, the colonoscope                            was passed under direct vision. Throughout the                            procedure, the patient's blood pressure, pulse, and                    oxygen saturations were monitored continuously. The                            EC-[id number] ([id number]) scope was introduced through                            the anus and advanced to the the cecum, identified                            by appendiceal orifice and ileocecal valve. The                            colonoscopy was performed without difficulty. The                            patient tolerated the procedure well. The quality                            of the bowel preparation was good. The terminal                            ileum, the ileocecal valve, the appendiceal orifice                            and the rectum were photographed. Scope In: 12:15:32 PM Scope Out: 12:27:53 PM Scope Withdrawal Time: 0 hours 8 minutes 57 seconds  Total Procedure Duration: 0 hours 12 minutes 21 seconds  Findings:      The perianal and digital rectal examinations were normal.      The entire examined colon appeared normal. Impression:               - The entire examined colon is normal.                           - No specimens collected. Moderate Sedation:      N/A- Per Anesthesia Care Recommendation:           - Patient has a contact number available for                            emergencies. The  signs and symptoms of potential                            delayed complications were discussed with the                            patient. Return to normal activities tomorrow.                            Written discharge instructions were provided to the                            patient.                           - Repeat colonoscopy in 10 years for screening                            purposes.                           - Resume previous diet.                           - Continue present medications. Procedure Code(s):        --- Professional ---                           [id number], Colorectal cancer screening; colonoscopy on                            individual not meeting  criteria for high risk Diagnosis Code(s):        --- Professional ---                           Z12.11, Encounter for screening for malignant                            neoplasm of colon CPT copyright 2016 [employer]. All rights reserved. The codes documented in this report are preliminary and upon coder review may  be revised to meet current compliance requirements. Danise EdgeMartin Yeshaya Vath, MD Charolett BumpersMartin K Philipe Laswell, MD 06/06/2016 12:35:56 PM This report has been signed electronically. Number of Addenda: 0

## 2016-06-06 NOTE — Transfer of Care (Signed)
Immediate Anesthesia Transfer of Care Note  Patient: [name]  Procedure(s) Performed: Procedure(s): COLONOSCOPY WITH PROPOFOL (N/A)  Patient Location: PACU and Endoscopy Unit  Anesthesia Type:MAC  Level of Consciousness: awake, alert  and patient cooperative  Airway & Oxygen Therapy: Patient Spontanous Breathing and Patient connected to face mask oxygen  Post-op Assessment: Report given to RN and Post -op Vital signs reviewed and stable  Post vital signs: Reviewed and stable  Last Vitals:  Vitals:   06/06/16 1111  BP: (!) 159/101  Pulse: 66  Resp: 19  Temp: 36.7 C    Last Pain:  Vitals:   06/06/16 1111  TempSrc: Oral         Complications: No apparent anesthesia complications

## 2016-06-06 NOTE — Anesthesia Procedure Notes (Signed)
Procedure Name: MAC Date/Time: 06/06/2016 12:08 PM Performed by: Dione Booze Pre-anesthesia Checklist: Patient identified, Emergency Drugs available, Suction available and Patient being monitored Patient Re-evaluated:Patient Re-evaluated prior to inductionOxygen Delivery Method: Simple face mask Placement Confirmation: positive ETCO2

## 2016-06-06 NOTE — Anesthesia Preprocedure Evaluation (Addendum)
Anesthesia Evaluation  Patient identified by MRN, date of birth, ID band Patient awake    Reviewed: Allergy & Precautions, NPO status , Patient's Chart, lab work & pertinent test results  Airway Mallampati: II  TM Distance: >3 FB Neck ROM: Full    Dental  (+) Teeth Intact, Dental Advisory Given   Pulmonary asthma ,    breath sounds clear to auscultation       Cardiovascular hypertension, Pt. on medications  Rhythm:Regular Rate:Normal     Neuro/Psych negative neurological ROS  negative psych ROS   GI/Hepatic Neg liver ROS, GERD  Medicated,  Endo/Other  negative endocrine ROS  Renal/GU negative Renal ROS  negative genitourinary   Musculoskeletal  (+) Arthritis , Osteoarthritis,    Abdominal   Peds negative pediatric ROS (+)  Hematology negative hematology ROS (+)   Anesthesia Other Findings   Reproductive/Obstetrics negative OB ROS                            Anesthesia Physical Anesthesia Plan  ASA: II  Anesthesia Plan: MAC   Post-op Pain Management:    Induction: Intravenous  Airway Management Planned: Natural Airway  Additional Equipment:   Intra-op Plan:   Post-operative Plan:   Informed Consent: I have reviewed the patients History and Physical, chart, labs and discussed the procedure including the risks, benefits and alternatives for the proposed anesthesia with the patient or authorized representative who has indicated his/her understanding and acceptance.     Plan Discussed with: CRNA  Anesthesia Plan Comments:         Anesthesia Quick Evaluation

## 2016-06-07 ENCOUNTER — Encounter (HOSPITAL_COMMUNITY): Payer: Self-pay | Admitting: Gastroenterology

## 2016-06-11 DIAGNOSIS — I10 Essential (primary) hypertension: Secondary | ICD-10-CM | POA: Diagnosis not present

## 2016-06-11 DIAGNOSIS — S0990XA Unspecified injury of head, initial encounter: Secondary | ICD-10-CM | POA: Diagnosis not present

## 2016-06-11 DIAGNOSIS — S0101XA Laceration without foreign body of scalp, initial encounter: Secondary | ICD-10-CM | POA: Diagnosis not present

## 2016-06-11 DIAGNOSIS — R4781 Slurred speech: Secondary | ICD-10-CM | POA: Diagnosis not present

## 2016-06-14 DIAGNOSIS — Z4802 Encounter for removal of sutures: Secondary | ICD-10-CM | POA: Diagnosis not present

## 2016-07-30 ENCOUNTER — Emergency Department (HOSPITAL_COMMUNITY)
Admission: EM | Admit: 2016-07-30 | Discharge: 2016-07-30 | Disposition: A | Discharge status: Home / Self Care | Payer: 59 | Attending: Emergency Medicine | Admitting: Emergency Medicine

## 2016-07-30 ENCOUNTER — Encounter (HOSPITAL_COMMUNITY): Payer: Self-pay | Admitting: Emergency Medicine

## 2016-07-30 ENCOUNTER — Emergency Department (HOSPITAL_COMMUNITY): Payer: 59

## 2016-07-30 DIAGNOSIS — J45909 Unspecified asthma, uncomplicated: Secondary | ICD-10-CM | POA: Insufficient documentation

## 2016-07-30 DIAGNOSIS — Y92410 Unspecified street and highway as the place of occurrence of the external cause: Secondary | ICD-10-CM | POA: Insufficient documentation

## 2016-07-30 DIAGNOSIS — Z23 Encounter for immunization: Secondary | ICD-10-CM | POA: Diagnosis not present

## 2016-07-30 DIAGNOSIS — Y999 Unspecified external cause status: Secondary | ICD-10-CM | POA: Diagnosis not present

## 2016-07-30 DIAGNOSIS — I10 Essential (primary) hypertension: Secondary | ICD-10-CM | POA: Insufficient documentation

## 2016-07-30 DIAGNOSIS — Z79899 Other long term (current) drug therapy: Secondary | ICD-10-CM | POA: Diagnosis not present

## 2016-07-30 DIAGNOSIS — S52041A Displaced fracture of coronoid process of right ulna, initial encounter for closed fracture: Secondary | ICD-10-CM | POA: Insufficient documentation

## 2016-07-30 DIAGNOSIS — S53114A Anterior dislocation of right ulnohumeral joint, initial encounter: Secondary | ICD-10-CM | POA: Diagnosis not present

## 2016-07-30 DIAGNOSIS — S53024A Posterior dislocation of right radial head, initial encounter: Secondary | ICD-10-CM | POA: Insufficient documentation

## 2016-07-30 DIAGNOSIS — S53101A Unspecified subluxation of right ulnohumeral joint, initial encounter: Secondary | ICD-10-CM | POA: Diagnosis not present

## 2016-07-30 DIAGNOSIS — S53124A Posterior dislocation of right ulnohumeral joint, initial encounter: Secondary | ICD-10-CM

## 2016-07-30 DIAGNOSIS — W19XXXA Unspecified fall, initial encounter: Secondary | ICD-10-CM

## 2016-07-30 DIAGNOSIS — Y939 Activity, unspecified: Secondary | ICD-10-CM | POA: Diagnosis not present

## 2016-07-30 DIAGNOSIS — S59901A Unspecified injury of right elbow, initial encounter: Secondary | ICD-10-CM | POA: Diagnosis present

## 2016-07-30 DIAGNOSIS — Z7982 Long term (current) use of aspirin: Secondary | ICD-10-CM | POA: Diagnosis not present

## 2016-07-30 DIAGNOSIS — R52 Pain, unspecified: Secondary | ICD-10-CM

## 2016-07-30 DIAGNOSIS — M25551 Pain in right hip: Secondary | ICD-10-CM | POA: Diagnosis not present

## 2016-07-30 MED ORDER — MORPHINE SULFATE (PF) 4 MG/ML IV SOLN
4.0000 mg | Freq: Once | INTRAVENOUS | Status: AC
  Administered 2016-07-30: 4 mg via INTRAVENOUS
  Filled 2016-07-30: qty 1

## 2016-07-30 MED ORDER — OXYCODONE-ACETAMINOPHEN 5-325 MG PO TABS: 1.0000 | ORAL_TABLET | ORAL | 0 refills | Status: DC | PRN

## 2016-07-30 MED ORDER — FENTANYL CITRATE (PF) 100 MCG/2ML IJ SOLN
100.0000 ug | Freq: Once | INTRAMUSCULAR | Status: AC
  Administered 2016-07-30: 100 ug via INTRAVENOUS
  Filled 2016-07-30: qty 2

## 2016-07-30 MED ORDER — OXYCODONE-ACETAMINOPHEN 5-325 MG PO TABS
1.0000 | ORAL_TABLET | Freq: Once | ORAL | Status: AC
  Administered 2016-07-30: 1 via ORAL
  Filled 2016-07-30: qty 1

## 2016-07-30 MED ORDER — HYDROMORPHONE HCL 1 MG/ML IJ SOLN
1.0000 mg | Freq: Once | INTRAMUSCULAR | Status: AC
  Administered 2016-07-30: 1 mg via INTRAVENOUS
  Filled 2016-07-30: qty 1

## 2016-07-30 MED ORDER — TETANUS-DIPHTH-ACELL PERTUSSIS 5-2.5-18.5 LF-MCG/0.5 IM SUSP
0.5000 mL | Freq: Once | INTRAMUSCULAR | Status: AC
  Administered 2016-07-30: 0.5 mL via INTRAMUSCULAR
  Filled 2016-07-30: qty 0.5

## 2016-07-30 MED ORDER — SODIUM CHLORIDE 0.9 % IV BOLUS (SEPSIS)
1000.0000 mL | Freq: Once | INTRAVENOUS | Status: AC
  Administered 2016-07-30: 1000 mL via INTRAVENOUS

## 2016-07-30 MED ORDER — HYDROMORPHONE HCL 1 MG/ML IJ SOLN
1.0000 mg | Freq: Once | INTRAMUSCULAR | Status: DC
  Filled 2016-07-30: qty 1

## 2016-07-30 MED ORDER — ETOMIDATE 2 MG/ML IV SOLN
12.0000 mg | Freq: Once | INTRAVENOUS | Status: AC
  Administered 2016-07-30: 12 mg via INTRAVENOUS
  Filled 2016-07-30: qty 10

## 2016-07-30 NOTE — ED Notes (Signed)
Patient was given a ice pack. 

## 2016-07-30 NOTE — ED Notes (Signed)
Patient being transported to x-ray.

## 2016-07-30 NOTE — Progress Notes (Signed)
Orthopedic Tech Progress Note Patient Details:  [name] 11/18/1965 [id number]  Ortho Devices Type of Ortho Device: Ace wrap, Sling immobilizer, Long arm splint Ortho Device/Splint Location: rue Ortho Device/Splint Interventions: Application   Nikki Dom 07/30/2016, 2:49 PM As ordered by Dr. Clarene Duke

## 2016-07-30 NOTE — Discharge Instructions (Signed)
You had a dislocation of your elbow with a fracture. We have reduced (relocated) your elbow and placed a splint. You should follow up with the Orthopaedic surgeon next week.
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
## 2016-07-30 NOTE — ED Notes (Signed)
Patient transported to X-ray 

## 2016-07-30 NOTE — ED Notes (Signed)
Ortho at bedside.

## 2016-07-30 NOTE — Sedation Documentation (Signed)
Family updated as to patient's status.

## 2016-08-01 DIAGNOSIS — M25521 Pain in right elbow: Secondary | ICD-10-CM | POA: Diagnosis not present

## 2016-08-08 DIAGNOSIS — M25521 Pain in right elbow: Secondary | ICD-10-CM | POA: Diagnosis not present

## 2016-08-22 DIAGNOSIS — M25521 Pain in right elbow: Secondary | ICD-10-CM | POA: Diagnosis not present

## 2016-08-22 DIAGNOSIS — M25621 Stiffness of right elbow, not elsewhere classified: Secondary | ICD-10-CM | POA: Diagnosis not present

## 2016-08-22 DIAGNOSIS — S43014D Anterior dislocation of right humerus, subsequent encounter: Secondary | ICD-10-CM | POA: Diagnosis not present

## 2016-08-29 DIAGNOSIS — S43014D Anterior dislocation of right humerus, subsequent encounter: Secondary | ICD-10-CM | POA: Diagnosis not present

## 2016-08-29 DIAGNOSIS — M25521 Pain in right elbow: Secondary | ICD-10-CM | POA: Diagnosis not present

## 2016-08-29 DIAGNOSIS — M25621 Stiffness of right elbow, not elsewhere classified: Secondary | ICD-10-CM | POA: Diagnosis not present

## 2016-08-31 DIAGNOSIS — M25621 Stiffness of right elbow, not elsewhere classified: Secondary | ICD-10-CM | POA: Diagnosis not present

## 2016-08-31 DIAGNOSIS — M25521 Pain in right elbow: Secondary | ICD-10-CM | POA: Diagnosis not present

## 2016-08-31 DIAGNOSIS — S43014D Anterior dislocation of right humerus, subsequent encounter: Secondary | ICD-10-CM | POA: Diagnosis not present

## 2016-09-09 DIAGNOSIS — M25621 Stiffness of right elbow, not elsewhere classified: Secondary | ICD-10-CM | POA: Diagnosis not present

## 2016-09-09 DIAGNOSIS — M25521 Pain in right elbow: Secondary | ICD-10-CM | POA: Diagnosis not present

## 2016-09-09 DIAGNOSIS — S43014D Anterior dislocation of right humerus, subsequent encounter: Secondary | ICD-10-CM | POA: Diagnosis not present

## 2016-09-16 NOTE — Addendum Note (Signed)
Addendum  created 09/16/16 1025 by Hollis, Kevin D, MD   Sign clinical note    

## 2017-02-26 DIAGNOSIS — Z23 Encounter for immunization: Secondary | ICD-10-CM | POA: Diagnosis not present

## 2017-03-19 ENCOUNTER — Encounter (HOSPITAL_COMMUNITY): Payer: Self-pay | Admitting: Emergency Medicine

## 2017-03-19 ENCOUNTER — Other Ambulatory Visit: Payer: Self-pay

## 2017-03-19 ENCOUNTER — Ambulatory Visit (HOSPITAL_COMMUNITY)
Admission: EM | Admit: 2017-03-19 | Discharge: 2017-03-19 | Disposition: A | Discharge status: Home / Self Care | Payer: 59 | Attending: Urgent Care | Admitting: Urgent Care

## 2017-03-19 DIAGNOSIS — M79645 Pain in left finger(s): Secondary | ICD-10-CM

## 2017-03-19 DIAGNOSIS — S61311A Laceration without foreign body of left index finger with damage to nail, initial encounter: Secondary | ICD-10-CM | POA: Diagnosis not present

## 2017-03-19 NOTE — ED Provider Notes (Signed)
MRN: [id number] DOB: 09-Mar-1966  Subjective:   [name] is a 51 y.o. male presenting for left index finger laceration he suffered today using a kitchen knife. Denies loss of ROM, loss of sensation. Tdap completed 07/30/2016.   No current facility-administered medications for this encounter.    Current Outpatient Medications  Medication Sig Dispense Refill  . ALPRAZolam (XANAX) 0.25 MG tablet Take 0.25 mg by mouth 2 (two) times daily as needed for anxiety.    Marland Kitchen. amLODipine (NORVASC) 10 MG tablet Take 10 mg by mouth daily.     Marland Kitchen. aspirin 325 MG tablet Take 325 mg by mouth every 6 (six) hours as needed.    Marland Kitchen. azelastine (ASTELIN) 137 MCG/SPRAY nasal spray Place 1 spray into both nostrils 3 (three) times daily. (Patient taking differently: Place 1 spray into both nostrils daily. ) 30 mL 6  . calcium carbonate (OS-CAL - DOSED IN MG OF ELEMENTAL CALCIUM) 1250 (500 Ca) MG tablet Take 1 tablet by mouth daily.    . cetirizine (ZYRTEC) 10 MG tablet Take 10 mg by mouth daily.     . cholecalciferol (VITAMIN D) 1000 units tablet Take 1,000 Units by mouth daily.     . fluticasone (FLONASE) 50 MCG/ACT nasal spray Place 1 spray into both nostrils daily.     . hydrochlorothiazide (HYDRODIURIL) 25 MG tablet Take 25 mg by mouth daily.     . naproxen sodium (ANAPROX) 220 MG tablet Take 220 mg by mouth daily as needed (for pain.).     Marland Kitchen. omeprazole (PRILOSEC) 20 MG capsule Take 20 mg by mouth daily.     Marland Kitchen. oxyCODONE-acetaminophen (PERCOCET/ROXICET) 5-325 MG tablet Take 1-2 tablets by mouth every 4 (four) hours as needed for severe pain. 15 tablet 0  . Polyethyl Glycol-Propyl Glycol (SYSTANE ULTRA) 0.4-0.3 % SOLN Place 1 drop into both eyes daily.    Bertram Gala. Polyethyl Glycol-Propyl Glycol (SYSTANE) 0.4-0.3 % GEL ophthalmic gel Place 1 application into both eyes at bedtime.    Marland Kitchen. PROAIR HFA 108 (90 BASE) MCG/ACT inhaler Inhale 1-2 puffs into the lungs every 6 (six) hours as needed for shortness of breath.     . SUPER B  COMPLEX/C PO Take 1 capsule by mouth at bedtime.    . TURMERIC PO Take 1 capsule by mouth at bedtime.    Marland Kitchen. VITAMIN E PO Take 1 capsule by mouth daily.     [name] is allergic to food; doxycycline; and tetracycline.  [name]  has a past medical history of Asthma, Environmental allergies, and Hypertension. Also  has a past surgical history that includes Vasectomy (04/18/2001); Hernia repair (04/18/2001); and Colonoscopy with propofol (N/A, 06/06/2016).  Objective:   Vitals: BP (!) 155/100 (BP Location: Right Arm)   Pulse 79   Temp 98.2 F (36.8 C) (Oral)   Resp 18   SpO2 97%   Physical Exam  Constitutional: He is oriented to person, place, and time. He appears well-developed and well-nourished.  Cardiovascular: Normal rate.  Pulmonary/Chest: Effort normal.  Musculoskeletal:       Left hand: He exhibits tenderness and laceration (shave laceration over radial aspect of left distal index finger). He exhibits normal range of motion, no bony tenderness, normal two-point discrimination, normal capillary refill, no deformity and no swelling. Normal sensation noted. Normal strength noted.  Neurological: He is alert and oriented to person, place, and time.  Skin: Skin is warm and dry.   PROCEDURE NOTE: laceration repair Verbal consent obtained from patient.  Local anesthesia with 2cc  Lidocaine 2% without epinephrine. Wound scrubbed with soap and water and rinsed. Wound closed with #5 5-0 Prolene (simple interrupted) sutures over a foil graft.  Wound cleansed and dressed.   Assessment and Plan :   Laceration of left index finger without foreign body with damage to nail, initial encounter  Finger pain, left  Laceration repaired with foil graft. Wound care reviewed. Return-to-clinic precautions discussed, patient verbalized understanding. Otherwise, f/u in 1 week for wound recheck. Consider suture removal in approximately 2 weeks.  Wallis BambergMario Irianna Gilday, PA-C [facility]  03/19/2017  6:18 PM     Wallis BambergMani, Katherleen Folkes, PA-C 03/19/17 1941

## 2017-03-19 NOTE — ED Triage Notes (Signed)
Laceration today on left index finger.  Chopping spices, cut index finger

## 2017-03-19 NOTE — Discharge Instructions (Signed)
Please keep your wound clean and dry as much as possible. Remove your dressing from today in 24 hours. You may take 500mg  Tylenol with ibuprofen 600mg  every 6 hours for pain and inflammation.

## 2017-03-29 DIAGNOSIS — T148XXA Other injury of unspecified body region, initial encounter: Secondary | ICD-10-CM | POA: Diagnosis not present

## 2017-03-29 DIAGNOSIS — I10 Essential (primary) hypertension: Secondary | ICD-10-CM | POA: Diagnosis not present

## 2017-03-29 DIAGNOSIS — L089 Local infection of the skin and subcutaneous tissue, unspecified: Secondary | ICD-10-CM | POA: Diagnosis not present

## 2017-03-30 DIAGNOSIS — I10 Essential (primary) hypertension: Secondary | ICD-10-CM | POA: Diagnosis not present

## 2018-02-05 DIAGNOSIS — Z23 Encounter for immunization: Secondary | ICD-10-CM | POA: Diagnosis not present

## 2018-02-24 DIAGNOSIS — J209 Acute bronchitis, unspecified: Secondary | ICD-10-CM | POA: Diagnosis not present

## 2018-02-24 DIAGNOSIS — R062 Wheezing: Secondary | ICD-10-CM | POA: Diagnosis not present

## 2018-04-05 DIAGNOSIS — J069 Acute upper respiratory infection, unspecified: Secondary | ICD-10-CM | POA: Diagnosis not present

## 2018-06-15 DIAGNOSIS — J4521 Mild intermittent asthma with (acute) exacerbation: Secondary | ICD-10-CM | POA: Diagnosis not present

## 2019-07-10 ENCOUNTER — Ambulatory Visit: Payer: No Typology Code available for payment source | Attending: Internal Medicine

## 2019-07-10 DIAGNOSIS — Z20822 Contact with and (suspected) exposure to covid-19: Secondary | ICD-10-CM | POA: Insufficient documentation

## 2019-07-11 LAB — SARS-COV-2, NAA 2 DAY TAT

## 2019-07-11 LAB — NOVEL CORONAVIRUS, NAA: SARS-CoV-2, NAA: NOT DETECTED

## 2019-11-01 ENCOUNTER — Other Ambulatory Visit (HOSPITAL_COMMUNITY): Payer: Self-pay | Admitting: Internal Medicine

## 2019-11-01 DIAGNOSIS — M79604 Pain in right leg: Secondary | ICD-10-CM

## 2019-11-04 ENCOUNTER — Other Ambulatory Visit: Payer: Self-pay

## 2019-11-04 ENCOUNTER — Ambulatory Visit (HOSPITAL_COMMUNITY)
Admission: RE | Admit: 2019-11-04 | Discharge: 2019-11-04 | Disposition: A | Discharge status: Home / Self Care | Payer: No Typology Code available for payment source | Source: Ambulatory Visit | Attending: Internal Medicine | Admitting: Internal Medicine

## 2019-11-04 DIAGNOSIS — M7989 Other specified soft tissue disorders: Secondary | ICD-10-CM | POA: Insufficient documentation

## 2019-11-04 DIAGNOSIS — M79604 Pain in right leg: Secondary | ICD-10-CM | POA: Diagnosis present

## 2019-11-04 NOTE — Progress Notes (Signed)
Lower extremity venous has been completed.   Preliminary results in CV Proc.   Blanch Media 11/04/2019 9:12 AM

## 2020-10-12 ENCOUNTER — Ambulatory Visit
Admission: RE | Admit: 2020-10-12 | Discharge: 2020-10-12 | Disposition: A | Discharge status: Home / Self Care | Payer: No Typology Code available for payment source | Source: Ambulatory Visit | Attending: Internal Medicine | Admitting: Internal Medicine

## 2020-10-12 ENCOUNTER — Other Ambulatory Visit: Payer: Self-pay | Admitting: Internal Medicine

## 2020-10-12 DIAGNOSIS — L03116 Cellulitis of left lower limb: Secondary | ICD-10-CM

## 2020-10-16 ENCOUNTER — Other Ambulatory Visit: Payer: Self-pay | Admitting: Internal Medicine

## 2020-10-16 DIAGNOSIS — L03116 Cellulitis of left lower limb: Secondary | ICD-10-CM

## 2020-11-02 ENCOUNTER — Ambulatory Visit
Admission: RE | Admit: 2020-11-02 | Discharge: 2020-11-02 | Disposition: A | Discharge status: Home / Self Care | Payer: No Typology Code available for payment source | Source: Ambulatory Visit | Attending: Internal Medicine | Admitting: Internal Medicine

## 2020-11-02 DIAGNOSIS — L03116 Cellulitis of left lower limb: Secondary | ICD-10-CM

## 2020-12-01 NOTE — Progress Notes (Signed)
Synopsis: Referred for reactive airway disease by Renford Dills, MD  Subjective:   PATIENT ID: [name] GENDER: male DOB: [birth year], MRN: [id number]  Chief Complaint  Patient presents with   Consult    Referred by Dr.Polite. Covid in April gotten better but feeling sick again. On prednisone.        54yM with history of asthma previously followed by Dr. Delton Coombes, [place] previously on immunotherapy, GERD - on omeprazole 20 mg daily, HTN referred for multiple exacerbations requiring prednisone.  He does feel like his GERD is worse and has tried taking his omeprazole nightly.   He had covid-19 and was treated with paxlovid in April 2022.   Had LLE cellulitis after insect bite and was prescribed multiple ABX (keflex, clindamycin, ceftriaxone, bactrim) for about a month. After infection started improving developed worsening dyspnea, dry cough, wheeze, hoarseness. Prednisone improved his dyspnea. Has had two 6 day tapers of prednisone, currently finishing 10 day taper. It does seem like it's improved dyspnea but especially cough when he's having to speak all day. Has limited his activity substantially because he does feel like he's at risk for worsening cough, dyspnea, wheeze.   He has been on breztri for about 2-3 weeks and does feel like it's been helpful. No spacer. Hoarseness onset after breztri.   He has not had any CXR or PFTs since onset of his dyspnea.      Otherwise pertinent review of systems is negative.  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, has been there for about 10 years. Has lived in [place], [place]. Avid cyclist.   Past Medical History:  Diagnosis Date   Asthma    Environmental allergies    Hypertension    since age 86     Family History  Problem Relation Age of Onset   Hypertension Father    Cancer Father        lung   Cancer Mother        ovarian/breast   Heart attack Mother    Hypertension Mother      Past Surgical History:  Procedure Laterality Date    COLONOSCOPY WITH PROPOFOL N/A 06/06/2016   Procedure: COLONOSCOPY WITH PROPOFOL;  Surgeon: Charolett Bumpers, [place];  Location: [facility];  Service: Endoscopy;  Laterality: N/A;   HERNIA REPAIR  04/18/2001   VASECTOMY  04/18/2001    Social History   Socioeconomic History   Marital status: Married    Spouse name: Not on file   Number of children: 2   Years of education: Not on file   Highest education level: Not on file  Occupational History   Occupation: PRESIDENT    Employer: [employer]  Tobacco Use   Smoking status: Never   Smokeless tobacco: Never  Substance and Sexual Activity   Alcohol use: Yes    Comment: occasional   Drug use: No   Sexual activity: Not on file  Other Topics Concern   Not on file  Social History Narrative   Not on file   Social Determinants of Health   Financial Resource Strain: Not on file  Food Insecurity: Not on file  Transportation Needs: Not on file  Physical Activity: Not on file  Stress: Not on file  Social Connections: Not on file  Intimate Partner Violence: Not on file     Allergies  Allergen Reactions   Food Other (See Comments)    Peanuts/walnut allergy confirmed per allergy testing   Doxycycline Other (See Comments)    Gastrointestinal  issues Other reaction(s): Other (See Comments) Gastrointestinal issues   Tetracycline Other (See Comments)    Gastrointestinal issues     Outpatient Medications Prior to Visit  Medication Sig Dispense Refill   ALPRAZolam (XANAX) 0.25 MG tablet Take 0.25 mg by mouth 2 (two) times daily as needed for anxiety.     amLODipine (NORVASC) 10 MG tablet Take 10 mg by mouth daily.      azelastine (ASTELIN) 137 MCG/SPRAY nasal spray Place 1 spray into both nostrils 3 (three) times daily. (Patient taking differently: Place 1 spray into both nostrils daily.) 30 mL 6   cetirizine (ZYRTEC) 10 MG tablet Take 10 mg by mouth daily.      cholecalciferol (VITAMIN D) 1000 units tablet Take 1,000 Units by mouth  daily.      hydrochlorothiazide (HYDRODIURIL) 25 MG tablet Take 25 mg by mouth daily.      naproxen sodium (ANAPROX) 220 MG tablet Take 220 mg by mouth daily as needed (for pain.).      omeprazole (PRILOSEC) 20 MG capsule Take 20 mg by mouth daily.      Polyethyl Glycol-Propyl Glycol (SYSTANE) 0.4-0.3 % GEL ophthalmic gel Place 1 application into both eyes at bedtime.     Polyethyl Glycol-Propyl Glycol 0.4-0.3 % SOLN Place 1 drop into both eyes daily.     aspirin 325 MG tablet Take 325 mg by mouth every 6 (six) hours as needed.     calcium carbonate (OS-CAL - DOSED IN MG OF ELEMENTAL CALCIUM) 1250 (500 Ca) MG tablet Take 1 tablet by mouth daily. (Patient not taking: Reported on 12/02/2020)     fluticasone (FLONASE) 50 MCG/ACT nasal spray Place 1 spray into both nostrils daily.  (Patient not taking: Reported on 12/02/2020)     PROAIR HFA 108 (90 BASE) MCG/ACT inhaler Inhale 1-2 puffs into the lungs every 6 (six) hours as needed for shortness of breath.  (Patient not taking: Reported on 12/02/2020)     SUPER B COMPLEX/C PO Take 1 capsule by mouth at bedtime. (Patient not taking: Reported on 12/02/2020)     TURMERIC PO Take 1 capsule by mouth at bedtime. (Patient not taking: Reported on 12/02/2020)     VITAMIN E PO Take 1 capsule by mouth daily. (Patient not taking: Reported on 12/02/2020)     oxyCODONE-acetaminophen (PERCOCET/ROXICET) 5-325 MG tablet Take 1-2 tablets by mouth every 4 (four) hours as needed for severe pain. 15 tablet 0   No facility-administered medications prior to visit.       Objective:   Physical Exam:  General appearance: 55 y.o., male, NAD, conversant  Eyes: anicteric sclerae, moist conjunctivae; no lid-lag; PERRL, tracking appropriately HENT: NCAT; oropharynx, MMM, no mucosal ulcerations; normal hard and soft palate Neck: Trachea midline; no lymphadenopathy, no JVD Lungs: CTAB, no crackles, no wheeze, with normal respiratory effort CV: RRR, no MRGs  Abdomen: Soft,  non-tender; non-distended, BS present  Extremities: No peripheral edema, radial and DP pulses present bilaterally  Skin: Normal temperature, turgor and texture; no rash Psych: Appropriate affect Neuro: Alert and oriented to person and place, no focal deficit    Vitals:   12/02/20 1346  BP: (!) 140/98  Pulse: 92  Temp: 98.3 F (36.8 C)  TempSrc: Oral  SpO2: 97%  Weight: 205 lb 3.2 oz (93.1 kg)  Height: 5\' 9"  (1.753 m)   97% on RA BMI Readings from Last 3 Encounters:  12/02/20 30.30 kg/m  07/30/16 25.83 kg/m  06/06/16 27.12 kg/m   Wt Readings from  Last 3 Encounters:  12/02/20 205 lb 3.2 oz (93.1 kg)  07/30/16 180 lb (81.6 kg)  06/06/16 189 lb (85.7 kg)     CBC    Component Value Date/Time   WBC 7.1 05/24/2013 1109   RBC 4.75 05/24/2013 1109   HGB 15.0 05/24/2013 1109   HCT 44.0 05/24/2013 1109   PLT 207.0 05/24/2013 1109   MCV 92.6 05/24/2013 1109   MCHC 34.0 05/24/2013 1109   RDW 13.0 05/24/2013 1109   LYMPHSABS 2.3 05/24/2013 1109   MONOABS 0.8 05/24/2013 1109   EOSABS 0.2 05/24/2013 1109   BASOSABS 0.0 05/24/2013 1109      Chest Imaging: CT Chest 05/29/13 reviewed by me and unremarkable   Pulmonary Functions Testing Results: PFT Results Latest Ref Rng & Units 07/08/2013  FVC-Pre L 5.00  FVC-Predicted Pre % 99  FVC-Post L 5.01  FVC-Predicted Post % 99  Pre FEV1/FVC % % 78  Post FEV1/FCV % % 78  FEV1-Pre L 3.88  FEV1-Predicted Pre % 98  FEV1-Post L 3.89   Methacholine challenge 07/08/13 negative for AHR      Assessment & Plan:   # Dyspnea # Cough # Wheeze   Prior testing not supportive of asthma with negative methacholine challenge. Exercise-related asthma or EILO however are still possibilities. Other steroid responsive processes come to mind as well given recent covid-19 infection and long ABX exposure - organizing pneumonia or other ILD although feel this is unlikely. Refractory GERD may be playing role and we'll try to escalate treatment.  Finally alternative consideration could be VCD - PCP has heard upper airway wheezing in clinic per pt - although his dyspnea/wheeze doesn't always coincide with throat tightness.  Plan: - CT Chest  - PFTs - ok to finish steroid taper - ok to continue breztri for now but ideally come off of it for 2 days before your PFTs - can resume if he feels terrible off of it - increase ppi to omeprazole 40 mg daily before breakfast - discuss sleep study next visit    Omar Person, MD [facility] 12/02/2020 1:57 PM

## 2020-12-02 ENCOUNTER — Ambulatory Visit (INDEPENDENT_AMBULATORY_CARE_PROVIDER_SITE_OTHER): Payer: No Typology Code available for payment source | Admitting: Student

## 2020-12-02 ENCOUNTER — Encounter: Payer: Self-pay | Admitting: Student

## 2020-12-02 ENCOUNTER — Other Ambulatory Visit: Payer: Self-pay

## 2020-12-02 VITALS — BP 140/98 | HR 92 | Temp 98.3°F | Ht 69.0 in | Wt 205.2 lb

## 2020-12-02 DIAGNOSIS — U071 COVID-19: Secondary | ICD-10-CM

## 2020-12-02 DIAGNOSIS — R0600 Dyspnea, unspecified: Secondary | ICD-10-CM | POA: Diagnosis not present

## 2020-12-02 DIAGNOSIS — K219 Gastro-esophageal reflux disease without esophagitis: Secondary | ICD-10-CM | POA: Diagnosis not present

## 2020-12-02 NOTE — Patient Instructions (Signed)
-   START taking omeprazole 40 mg every morning 30 minutes before eating - finish steroid taper - We'll schedule a CT of your chest to make sure there's no evidence of covid-19 or antibiotic-related organizing pneumonia or other form of interstitial lung disease that responds to steroids - ideally come off of breztri for 2 days before your PFTs - can resume if you feel terrible off of it - I will call with results to discuss next steps - see you in 3 months or sooner if need be, just send a message

## 2020-12-18 ENCOUNTER — Ambulatory Visit
Admission: RE | Admit: 2020-12-18 | Discharge: 2020-12-18 | Disposition: A | Discharge status: Home / Self Care | Payer: No Typology Code available for payment source | Source: Ambulatory Visit | Attending: Student | Admitting: Student

## 2020-12-18 DIAGNOSIS — R0600 Dyspnea, unspecified: Secondary | ICD-10-CM

## 2020-12-22 ENCOUNTER — Other Ambulatory Visit: Payer: Self-pay

## 2020-12-22 ENCOUNTER — Ambulatory Visit (INDEPENDENT_AMBULATORY_CARE_PROVIDER_SITE_OTHER): Payer: No Typology Code available for payment source | Admitting: Student

## 2020-12-22 DIAGNOSIS — R0600 Dyspnea, unspecified: Secondary | ICD-10-CM

## 2020-12-22 LAB — PULMONARY FUNCTION TEST
DL/VA % pred: 121 %
DL/VA: 5.28 ml/min/mmHg/L
DLCO cor % pred: 120 %
DLCO cor: 34.13 ml/min/mmHg
DLCO unc % pred: 120 %
DLCO unc: 34.13 ml/min/mmHg
FEF 25-75 Post: 3.68 L/sec
FEF 25-75 Pre: 3.31 L/sec
FEF2575-%Change-Post: 11 %
FEF2575-%Pred-Post: 114 %
FEF2575-%Pred-Pre: 102 %
FEV1-%Change-Post: 3 %
FEV1-%Pred-Post: 95 %
FEV1-%Pred-Pre: 91 %
FEV1-Post: 3.58 L
FEV1-Pre: 3.44 L
FEV1FVC-%Change-Post: 0 %
FEV1FVC-%Pred-Pre: 105 %
FEV6-%Change-Post: 4 %
FEV6-%Pred-Post: 94 %
FEV6-%Pred-Pre: 90 %
FEV6-Post: 4.41 L
FEV6-Pre: 4.22 L
FEV6FVC-%Change-Post: 0 %
FEV6FVC-%Pred-Post: 103 %
FEV6FVC-%Pred-Pre: 104 %
FVC-%Change-Post: 4 %
FVC-%Pred-Post: 90 %
FVC-%Pred-Pre: 87 %
FVC-Post: 4.42 L
FVC-Pre: 4.24 L
Post FEV1/FVC ratio: 81 %
Post FEV6/FVC ratio: 100 %
Pre FEV1/FVC ratio: 81 %
Pre FEV6/FVC Ratio: 100 %
RV % pred: 129 %
RV: 2.71 L
TLC % pred: 100 %
TLC: 6.93 L

## 2020-12-22 NOTE — Progress Notes (Signed)
PFT done today. 

## 2021-01-04 ENCOUNTER — Telehealth: Payer: Self-pay | Admitting: Student

## 2021-01-04 NOTE — Telephone Encounter (Signed)
Sent the following my chart message to Mr. Denis:  Regarding the CT Chest, I still feel you are low risk for lung cancer (despite father with history of it at 55yo) and that the abnormalities seen on the 9/2 scan are likely related to prior covid infection but I'm happy to order a 6 month follow up CT Chest just to keep an eye on the ground glass nodules seen on the 9/2 scan. Benefit of additional scan is additional surveillance for stability, risks are additional radiation exposure and potentially following and/or working up a benign (noncancerous) lesion, potentially including biopsy, etc if there is interval change. I think some patients would choose an additional scan, some would not but I think either is reasonable.   Regarding the cough/wheeze, I think it's still early in the trial of change with ppi. I don't have much testing that supports a diagnosis of asthma to date, and there's nothing on imaging that I have available that supports the diagnosis of a steroid-reponsive process. PFTs did not show obstruction or bronchodilator responsiveness - features that would be supportive of asthma if present. There are a couple more tests that we could pursue if you do think that steroids have benefited your cough/wheeze to establish the diagnosis of a steroid responsive process - otherwise I fear the metabolic and bone-mineral health risks of continued steroid exposure outweigh the potential benefit. The tests I would consider would be either: repeating methacholine challenge, pursuing exercise bronchoprovocation challenge (like methacholine challenge but instead of methacholine, the provocative substance is exercise and then measuring your spirometry), and/or flexible bronchoscopy to look for  non-asthmatic eosinophilic bronchitis (overall a relatively simple 5-10 minute procedure that can be done under general anesthesia or conscious sedation - if it were me though I'd probably choose general anesthesia).      Let me know what you think,   Dr. Thora Lance

## 2021-01-04 NOTE — Telephone Encounter (Signed)
Called and spoke with Patient's Wife Jae Dire (DPR).  Jae Dire stated they were requesting to know next steps with Dr. Thora Lance.  Jae Dire stated Patient's father died of lung cancer around the age of 38 and they were concerned with CT results and small lung nodules.  Jae Dire stated Patient is still having cough, but omeprazole has helped.  Jae Dire stated patient recently traveled for work in a private plane and after Patient returned home Jae Dire stated she could at times hear  wheezing and rales in Patient lungs.  Jae Dire stated Patient had requested Prednisone taper.   Jae Dire is aware Dr. Thora Lance is not in office today, but will return tomorrow.  Jae Dire asked if message could be sent to Dr. Thora Lance for recommendations and Prednisone.  Message routed to Dr. Thora Lance to advise

## 2021-10-07 DIAGNOSIS — M79672 Pain in left foot: Secondary | ICD-10-CM | POA: Diagnosis not present

## 2021-10-07 DIAGNOSIS — F419 Anxiety disorder, unspecified: Secondary | ICD-10-CM | POA: Diagnosis not present

## 2021-10-07 DIAGNOSIS — Z125 Encounter for screening for malignant neoplasm of prostate: Secondary | ICD-10-CM | POA: Diagnosis not present

## 2021-10-07 DIAGNOSIS — Z0000 Encounter for general adult medical examination without abnormal findings: Secondary | ICD-10-CM | POA: Diagnosis not present

## 2021-10-07 DIAGNOSIS — I10 Essential (primary) hypertension: Secondary | ICD-10-CM | POA: Diagnosis not present

## 2021-10-07 DIAGNOSIS — Z13220 Encounter for screening for lipoid disorders: Secondary | ICD-10-CM | POA: Diagnosis not present

## 2021-10-22 IMAGING — CT CT CHEST W/O CM
1 of 2 series · 15 of 32 positions shown, 19 images · non-contrast
Comparison: Chest CT May 29, 2013

CLINICAL DATA: Dyspnea and wheezing, history of asthma. COVID 19
infection in [place]. Multiple steroid courses.

EXAM:
CT CHEST WITHOUT CONTRAST
TECHNIQUE: Multidetector CT imaging of the chest was performed following the
standard protocol without IV contrast.

[Series 2: chest w/(date) · axial · 0.83mm/px · z∈[-323,-23]mm · 15 of 174 slices shown, 19 images]
[im 12/174  mediastinal]
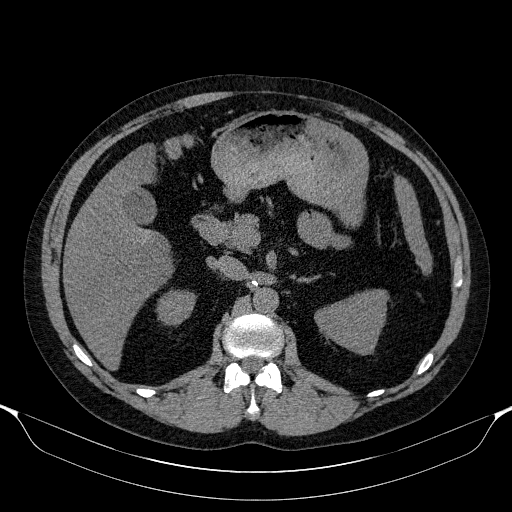
[im 12/174  lung]
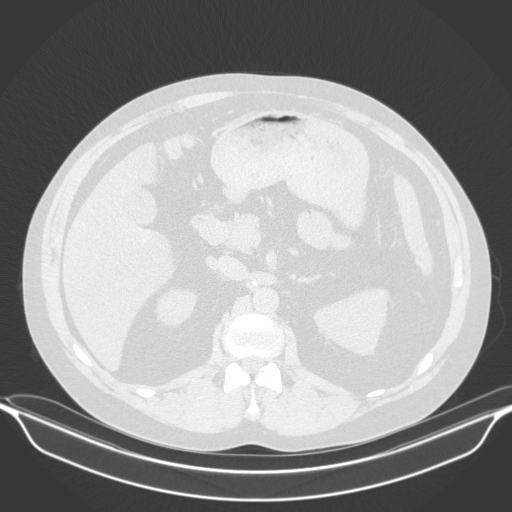
[im 24/174  lung]
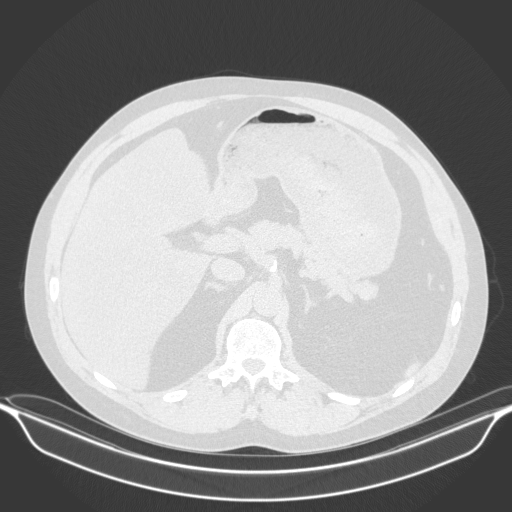
[im 35/174  lung]
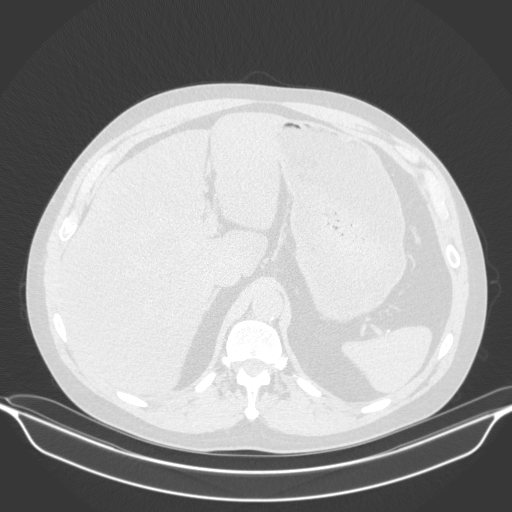
[im 47/174  lung]
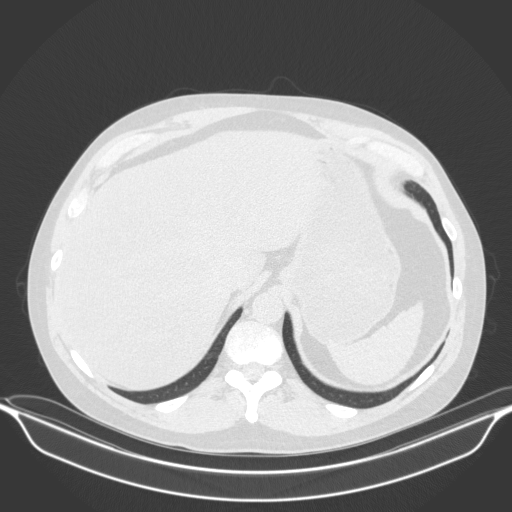
[im 58/174  mediastinal]
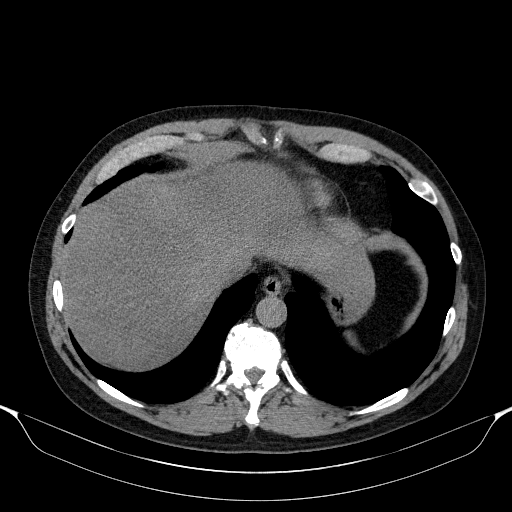
[im 58/174  lung]
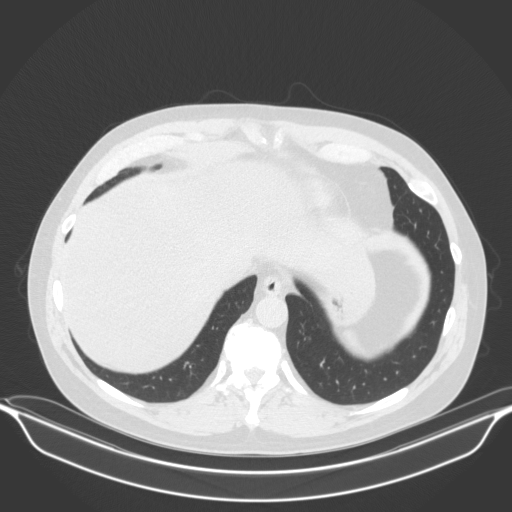
[im 70/174  lung]
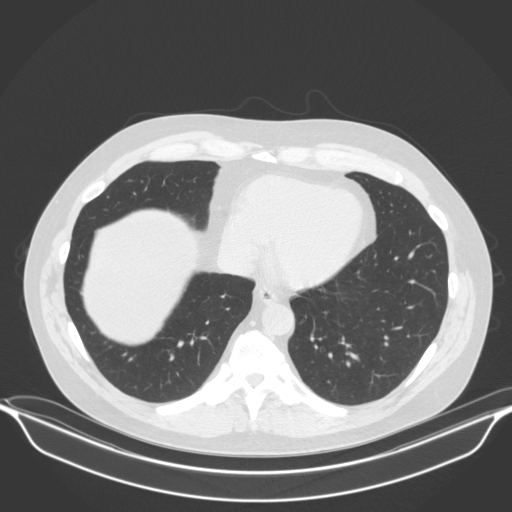
[im 81/174  lung]
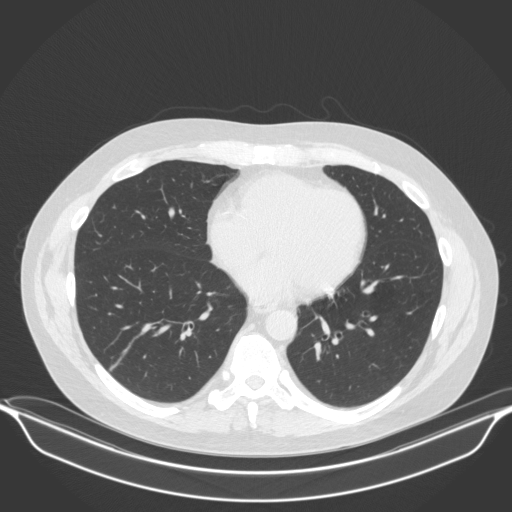
[im 87/174  lung]
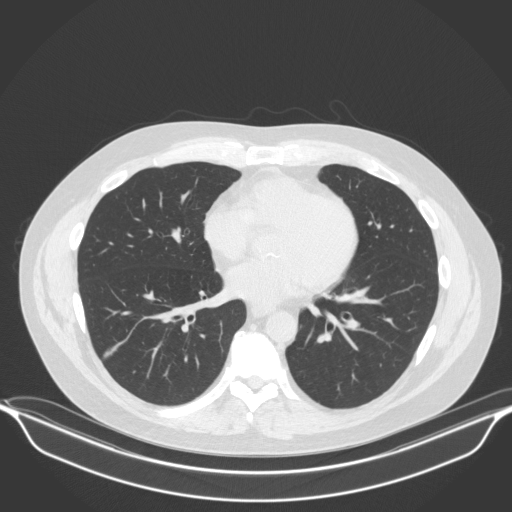
[im 93/174  mediastinal]
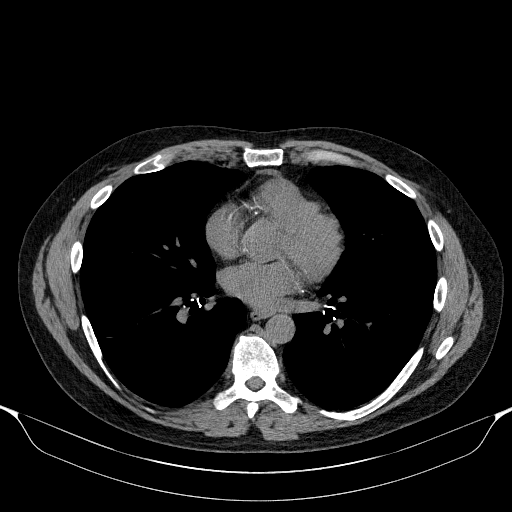
[im 93/174  lung]
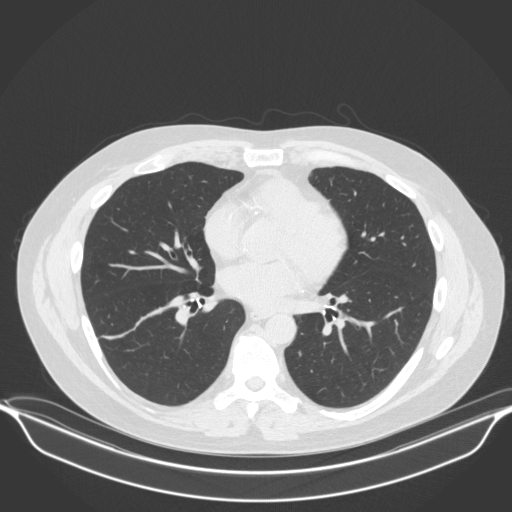
[im 104/174  lung]
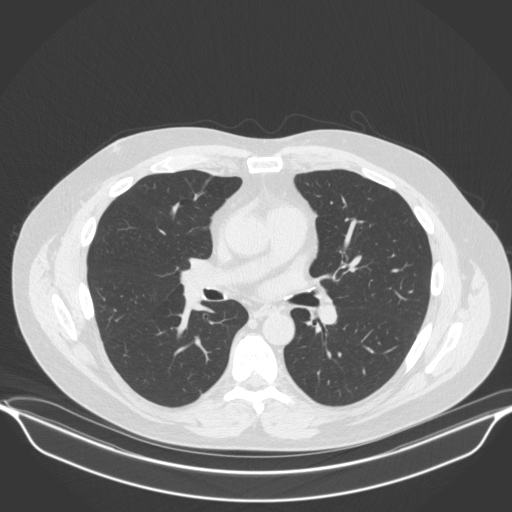
[im 116/174  lung]
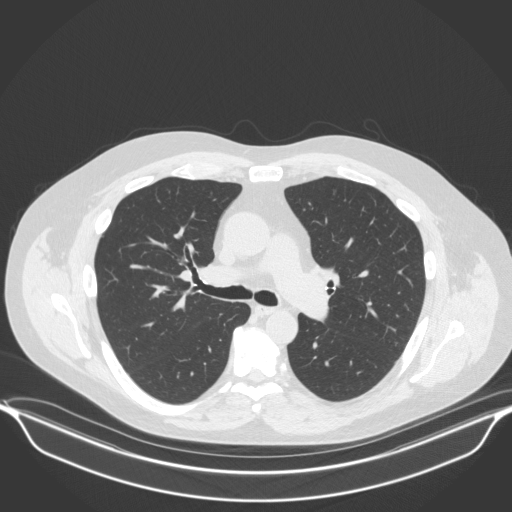
[im 127/174  lung]
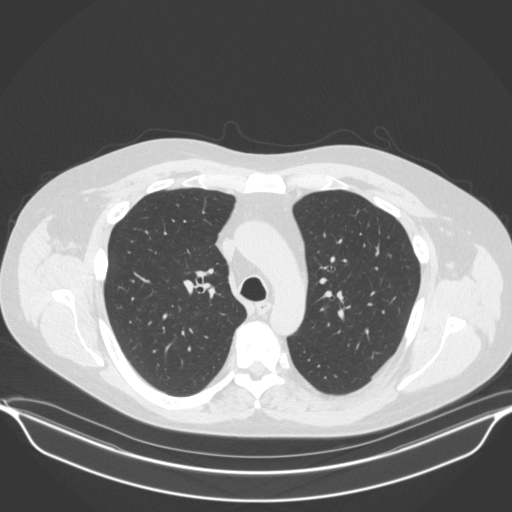
[im 139/174  mediastinal]
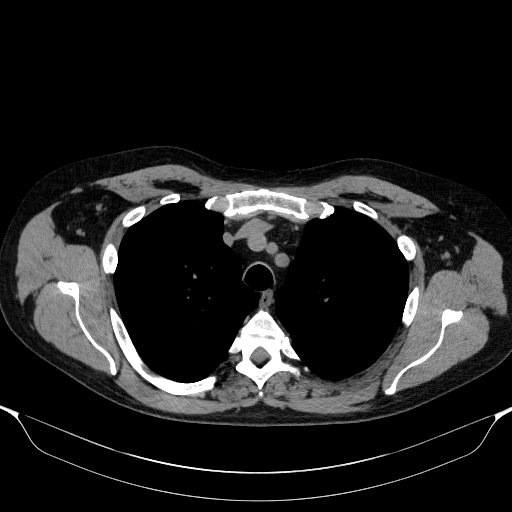
[im 139/174  lung]
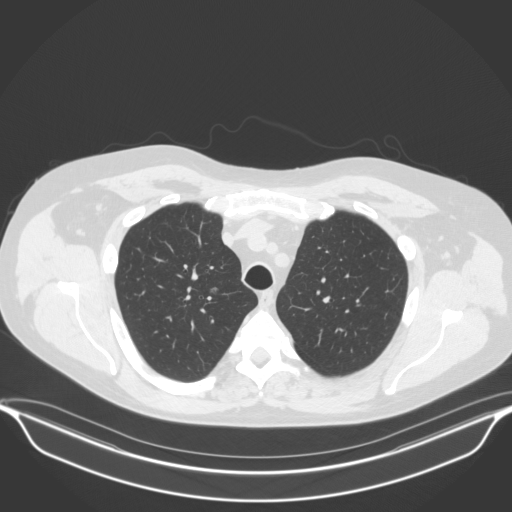
[im 150/174  lung]
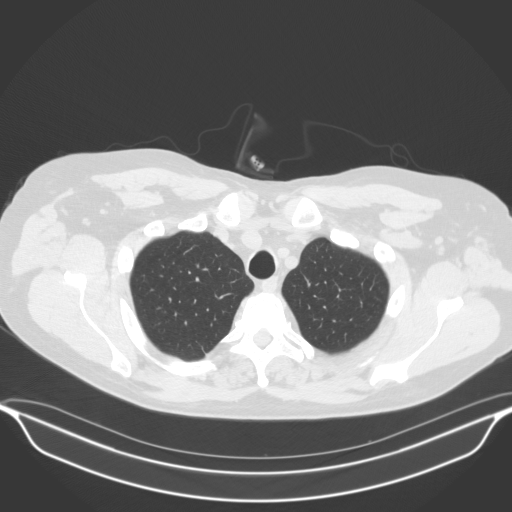
[im 162/174  lung]
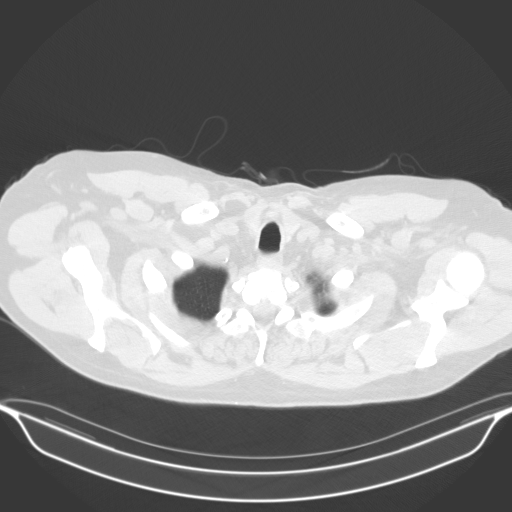

[15 of 32 positions shown; findings below may reference images not displayed]

FINDINGS: Cardiovascular: Scattered aortic atherosclerosis without aneurysmal
dilation. Coronary artery calcifications. Normal size heart. No
significant pericardial effusion/thickening. Normal caliber central
pulmonary arteries.

Mediastinum/Nodes: No discrete thyroid nodule. No pathologically
enlarged mediastinal, hilar or axillary lymph nodes, noting limited
sensitivity for the detection of hilar adenopathy on this
noncontrast study. Esophagus is unremarkable.

Lungs/Pleura: Scatter small ground-glass pulmonary nodules in the
right upper lobe measuring up to 5 mm in the paramedian right upper
lobe on image 35/5. Bibasilar atelectasis. No pleural effusion. No
pneumothorax.

Upper Abdomen: Diffuse hepatic steatosis.  No acute abnormality

Musculoskeletal: Bilateral gynecomastia. Multilevel degenerative
changes spine. No acute osseous abnormality.
IMPRESSION: 1. Scattered small ground-glass pulmonary nodules in the right upper
lobe measuring up to 5 mm, likely infectious or inflammatory. No
follow-up needed if patient is low-risk (and has no known or
suspected primary neoplasm). Non-contrast chest CT can be considered
in 12 months if patient is high-risk. This recommendation follows
the consensus statement: Guidelines for Management of Incidental
Pulmonary Nodules Detected on CT Images: From the [HOSPITAL]
2. No evidence of organizing pneumonia or pulmonary fibrosis.
3. Diffuse hepatic steatosis.
4.  Aortic Atherosclerosis ([id number]-V0P.P).

## 2021-11-16 DIAGNOSIS — F419 Anxiety disorder, unspecified: Secondary | ICD-10-CM | POA: Diagnosis not present

## 2022-03-21 ENCOUNTER — Telehealth: Payer: BC Managed Care – PPO | Admitting: Physician Assistant

## 2022-03-21 DIAGNOSIS — J208 Acute bronchitis due to other specified organisms: Secondary | ICD-10-CM | POA: Diagnosis not present

## 2022-03-21 MED ORDER — PREDNISONE 20 MG PO TABS: 40.0000 mg | ORAL_TABLET | Freq: Every day | ORAL | 0 refills | Status: DC

## 2022-03-21 MED ORDER — BENZONATATE 100 MG PO CAPS: 100.0000 mg | ORAL_CAPSULE | Freq: Three times a day (TID) | ORAL | 0 refills | Status: DC | PRN

## 2022-03-21 NOTE — Progress Notes (Signed)
I have spent 5 minutes in review of e-visit questionnaire, review and updating patient chart, medical decision making and response to patient.   Aidyn Sportsman Cody Mauria Asquith, PA-C    

## 2022-03-21 NOTE — Progress Notes (Signed)
We are sorry that you are not feeling well.  Here is how we plan to help!  Based on your presentation I believe you most likely have A cough due to a virus.  This is called viral bronchitis and is best treated by rest, plenty of fluids and control of the cough.  You may use Ibuprofen or Tylenol as directed to help your symptoms.     In addition you may use A prescription cough medication called Tessalon Perles 100mg . You may take 1-2 capsules every 8 hours as needed for your cough. Giving history of reactive airway, I will give a short course of prednisone.   From your responses in the eVisit questionnaire you describe inflammation in the upper respiratory tract which is causing a significant cough.  This is commonly called Bronchitis and has four common causes:   Allergies Viral Infections Acid Reflux Bacterial Infection Allergies, viruses and acid reflux are treated by controlling symptoms or eliminating the cause. An example might be a cough caused by taking certain blood pressure medications. You stop the cough by changing the medication. Another example might be a cough caused by acid reflux. Controlling the reflux helps control the cough.  USE OF BRONCHODILATOR ("RESCUE") INHALERS: There is a risk from using your bronchodilator too frequently.  The risk is that over-reliance on a medication which only relaxes the muscles surrounding the breathing tubes can reduce the effectiveness of medications prescribed to reduce swelling and congestion of the tubes themselves.  Although you feel brief relief from the bronchodilator inhaler, your asthma may actually be worsening with the tubes becoming more swollen and filled with mucus.  This can delay other crucial treatments, such as oral steroid medications. If you need to use a bronchodilator inhaler daily, several times per day, you should discuss this with your provider.  There are probably better treatments that could be used to keep your asthma under  control.     HOME CARE Only take medications as instructed by your medical team. Complete the entire course of an antibiotic. Drink plenty of fluids and get plenty of rest. Avoid close contacts especially the very young and the elderly Cover your mouth if you cough or cough into your sleeve. Always remember to wash your hands A steam or ultrasonic humidifier can help congestion.   GET HELP RIGHT AWAY IF: You develop worsening fever. You become short of breath You cough up blood. Your symptoms persist after you have completed your treatment plan MAKE SURE YOU  Understand these instructions. Will watch your condition. Will get help right away if you are not doing well or get worse.    Thank you for choosing an e-visit.  Your e-visit answers were reviewed by a board certified advanced clinical practitioner to complete your personal care plan. Depending upon the condition, your plan could have included both over the counter or prescription medications.  Please review your pharmacy choice. Make sure the pharmacy is open so you can pick up prescription now. If there is a problem, you may contact your provider through and have the prescription routed to another pharmacy.  Your safety is important to [employer]. If you have drug allergies check your prescription carefully.   For the next 24 hours you can use MyChart to ask questions about today's visit, request a non-urgent call back, or ask for a work or school excuse. You will get an email in the next two days asking about your experience. I hope that your e-visit  has been valuable and will speed your recovery.

## 2022-03-26 ENCOUNTER — Telehealth: Payer: No Typology Code available for payment source | Admitting: Nurse Practitioner

## 2022-03-26 DIAGNOSIS — J40 Bronchitis, not specified as acute or chronic: Secondary | ICD-10-CM

## 2022-03-26 MED ORDER — ALBUTEROL SULFATE HFA 108 (90 BASE) MCG/ACT IN AERS: 1.0000 | INHALATION_SPRAY | Freq: Four times a day (QID) | RESPIRATORY_TRACT | 0 refills | Status: AC | PRN

## 2022-03-26 MED ORDER — AZITHROMYCIN 250 MG PO TABS: ORAL_TABLET | ORAL | 0 refills | Status: AC

## 2022-03-26 NOTE — Progress Notes (Signed)
We are sorry that you are not feeling well.  Here is how we plan to help!  Based on your presentation I believe you most likely have A cough due to bacteria.  When patients have a fever and a productive cough with a change in color or increased sputum production, we are concerned about bacterial bronchitis.  If left untreated it can progress to pneumonia.  If your symptoms do not improve with your treatment plan it is important that you contact your provider.   I have prescribed Azithromyin 250 mg: two tablets now and then one tablet daily for 4 additonal days  and  an inhaler.    In addition you may use A non-prescription cough medication called Mucinex DM: take 2 tablets every 12 hours.   From your responses in the eVisit questionnaire you describe inflammation in the upper respiratory tract which is causing a significant cough.  This is commonly called Bronchitis and has four common causes:   Allergies Viral Infections Acid Reflux Bacterial Infection Allergies, viruses and acid reflux are treated by controlling symptoms or eliminating the cause. An example might be a cough caused by taking certain blood pressure medications. You stop the cough by changing the medication. Another example might be a cough caused by acid reflux. Controlling the reflux helps control the cough.  USE OF BRONCHODILATOR ("RESCUE") INHALERS: There is a risk from using your bronchodilator too frequently.  The risk is that over-reliance on a medication which only relaxes the muscles surrounding the breathing tubes can reduce the effectiveness of medications prescribed to reduce swelling and congestion of the tubes themselves.  Although you feel brief relief from the bronchodilator inhaler, your asthma may actually be worsening with the tubes becoming more swollen and filled with mucus.  This can delay other crucial treatments, such as oral steroid medications. If you need to use a bronchodilator inhaler daily, several times  per day, you should discuss this with your provider.  There are probably better treatments that could be used to keep your asthma under control.     HOME CARE Only take medications as instructed by your medical team. Complete the entire course of an antibiotic. Drink plenty of fluids and get plenty of rest. Avoid close contacts especially the very young and the elderly Cover your mouth if you cough or cough into your sleeve. Always remember to wash your hands A steam or ultrasonic humidifier can help congestion.   GET HELP RIGHT AWAY IF: You develop worsening fever. You become short of breath You cough up blood. Your symptoms persist after you have completed your treatment plan MAKE SURE YOU  Understand these instructions. Will watch your condition. Will get help right away if you are not doing well or get worse.    Thank you for choosing an e-visit.  Your e-visit answers were reviewed by a board certified advanced clinical practitioner to complete your personal care plan. Depending upon the condition, your plan could have included both over the counter or prescription medications.  Please review your pharmacy choice. Make sure the pharmacy is open so you can pick up prescription now. If there is a problem, you may contact your provider through [employer] and have the prescription routed to another pharmacy.  Your safety is important to [place]. If you have drug allergies check your prescription carefully.   For the next 24 hours you can use MyChart to ask questions about today's visit, request a non-urgent call back, or ask for a work  or school excuse. You will get an email in the next two days asking about your experience. I hope that your e-visit has been valuable and will speed your recovery.

## 2022-03-26 NOTE — Progress Notes (Signed)
I have spent 5 minutes in review of e-visit questionnaire, review and updating patient chart, medical decision making and response to patient.  ° °[name] Chyna Kneece, NP ° °  °

## 2022-03-29 ENCOUNTER — Telehealth: Payer: BC Managed Care – PPO | Admitting: Physician Assistant

## 2022-03-29 ENCOUNTER — Telehealth: Payer: Self-pay | Admitting: Family Medicine

## 2022-03-29 DIAGNOSIS — B9689 Other specified bacterial agents as the cause of diseases classified elsewhere: Secondary | ICD-10-CM | POA: Diagnosis not present

## 2022-03-29 DIAGNOSIS — J208 Acute bronchitis due to other specified organisms: Secondary | ICD-10-CM | POA: Diagnosis not present

## 2022-03-29 DIAGNOSIS — R062 Wheezing: Secondary | ICD-10-CM

## 2022-03-29 DIAGNOSIS — R052 Subacute cough: Secondary | ICD-10-CM

## 2022-03-29 MED ORDER — PREDNISONE 10 MG (21) PO TBPK: ORAL_TABLET | ORAL | 0 refills | Status: DC

## 2022-03-29 MED ORDER — BENZONATATE 100 MG PO CAPS: 100.0000 mg | ORAL_CAPSULE | Freq: Three times a day (TID) | ORAL | 0 refills | Status: DC | PRN

## 2022-03-29 NOTE — Addendum Note (Signed)
Addended by: Freddy Finner on: 03/29/2022 10:53 AM   Modules accepted: Level of Service

## 2022-03-29 NOTE — Progress Notes (Signed)
Because because you are having on going symptoms after first line treatment measures, I feel your condition warrants further evaluation and I recommend that you be seen in a face to face visit.   NOTE: There will be NO CHARGE for this eVisit   If you are having a true medical emergency please call 911.      For an urgent face to face visit, [facility] has seven urgent care centers for your convenience:     [facility] at [facility] Directions [phone] [address] [place], [place] [address]    [facility]gent [facility] Center [facility]) Get Driving Directions [phone] [address] [place], [place] [address]  [facility] Center [facility]) Get Driving Directions [phone] [address] [place],  [place]  [address]  [facility] - at [employer] Directions  [phone] [address] [place],  [place] [address]   [facility] Health Urgent Care at [facility] Get Driving Directions [phone] [address] [place] [address] [place], [place] [address]   [facility] Health Urgent Care at [facility] Get Driving Directions  [phone] [address] [place], [place] [address]   [facility] Health Urgent Care at [facility] Directions [phone] [address] [place], [place] [address]  Your MyChart E-visit questionnaire answers were reviewed by a board certified advanced clinical practitioner to complete your personal care plan based on your specific symptoms.  Thank you for using e-Visits.

## 2022-03-29 NOTE — Patient Instructions (Signed)
[name], thank you for joining Margaretann Loveless, PA-C for today's virtual visit.  While this provider is not your primary care provider (PCP), if your PCP is located in our provider database this encounter information will be shared with them immediately following your visit.   A [facility] MyChart account gives you access to today's visit and all your visits, tests, and labs performed at [facility] " click here if you don't have a [facility] MyChart account or go to [email or web address]  Consent: (Patient) [name] provided verbal consent for this virtual visit at the beginning of the encounter.  Current Medications:  Current Outpatient Medications:    benzonatate (TESSALON) 100 MG capsule, Take 1 capsule (100 mg total) by mouth 3 (three) times daily as needed., Disp: 30 capsule, Rfl: 0   predniSONE (STERAPRED UNI-PAK 21 TAB) 10 MG (21) TBPK tablet, 6 day taper; take as directed on package instructions, Disp: 21 tablet, Rfl: 0   albuterol (PROAIR HFA) 108 (90 Base) MCG/ACT inhaler, Inhale 1-2 puffs into the lungs every 6 (six) hours as needed for shortness of breath., Disp: 8 g, Rfl: 0   ALPRAZolam (XANAX) 0.25 MG tablet, Take 0.25 mg by mouth 2 (two) times daily as needed for anxiety., Disp: , Rfl:    amLODipine (NORVASC) 10 MG tablet, Take 10 mg by mouth daily. , Disp: , Rfl:    azelastine (ASTELIN) 137 MCG/SPRAY nasal spray, Place 1 spray into both nostrils 3 (three) times daily. (Patient taking differently: Place 1 spray into both nostrils daily.), Disp: 30 mL, Rfl: 6   azithromycin (ZITHROMAX) 250 MG tablet, Take 2 tablets on day 1, then 1 tablet daily on days 2 through 5, Disp: 6 tablet, Rfl: 0   Budeson-Glycopyrrol-Formoterol (BREZTRI AEROSPHERE) 160-9-4.8 MCG/ACT AERO, Inhale into the lungs., Disp: , Rfl:    calcium carbonate (OS-CAL - DOSED IN MG OF ELEMENTAL CALCIUM) 1250 (500 Ca) MG tablet, Take 1 tablet by mouth daily. (Patient not taking: Reported  on 12/02/2020), Disp: , Rfl:    cetirizine (ZYRTEC) 10 MG tablet, Take 10 mg by mouth daily. , Disp: , Rfl:    cholecalciferol (VITAMIN D) 1000 units tablet, Take 1,000 Units by mouth daily. , Disp: , Rfl:    fluticasone (FLONASE) 50 MCG/ACT nasal spray, Place 1 spray into both nostrils daily.  (Patient not taking: Reported on 12/02/2020), Disp: , Rfl:    hydrochlorothiazide (HYDRODIURIL) 25 MG tablet, Take 25 mg by mouth daily. , Disp: , Rfl:    naproxen sodium (ANAPROX) 220 MG tablet, Take 220 mg by mouth daily as needed (for pain.). , Disp: , Rfl:    omeprazole (PRILOSEC) 20 MG capsule, Take 20 mg by mouth daily. , Disp: , Rfl:    Polyethyl Glycol-Propyl Glycol (SYSTANE) 0.4-0.3 % GEL ophthalmic gel, Place 1 application into both eyes at bedtime., Disp: , Rfl:    Polyethyl Glycol-Propyl Glycol 0.4-0.3 % SOLN, Place 1 drop into both eyes daily., Disp: , Rfl:    SUPER B COMPLEX/C PO, Take 1 capsule by mouth at bedtime. (Patient not taking: Reported on 12/02/2020), Disp: , Rfl:    TURMERIC PO, Take 1 capsule by mouth at bedtime. (Patient not taking: Reported on 12/02/2020), Disp: , Rfl:    VITAMIN E PO, Take 1 capsule by mouth daily. (Patient not taking: Reported on 12/02/2020), Disp: , Rfl:    Medications ordered in this encounter:  Meds ordered this encounter  Medications   predniSONE (STERAPRED UNI-PAK 21 TAB) 10  MG (21) TBPK tablet    Sig: 6 day taper; take as directed on package instructions    Dispense:  21 tablet    Refill:  0    Order Specific Question:   Supervising Provider    Answer:   Merrilee Jansky [[id number]]   benzonatate (TESSALON) 100 MG capsule    Sig: Take 1 capsule (100 mg total) by mouth 3 (three) times daily as needed.    Dispense:  30 capsule    Refill:  0    Order Specific Question:   Supervising Provider    Answer:   Merrilee Jansky [id number]     *If you need refills on other medications prior to your next appointment, please contact your  pharmacy*  Follow-Up: Call back or seek an in-person evaluation if the symptoms worsen or if the condition fails to improve as anticipated.   Virtual Care [phone]  Other Instructions  Acute Bronchitis, Adult  Acute bronchitis is sudden inflammation of the main airways (bronchi) that come off the windpipe (trachea) in the lungs. The swelling causes the airways to get smaller and make more mucus than normal. This can make it hard to breathe and can cause coughing or noisy breathing (wheezing). Acute bronchitis may last several weeks. The cough may last longer. Allergies, asthma, and exposure to smoke may make the condition worse. What are the causes? This condition can be caused by germs and by substances that irritate the lungs, including: Cold and flu viruses. The most common cause of this condition is the virus that causes the common cold. Bacteria. This is less common. Breathing in substances that irritate the lungs, including: Smoke from cigarettes and other forms of tobacco. Dust and pollen. Fumes from household cleaning products, gases, or burned fuel. Indoor or outdoor air pollution. What increases the risk? The following factors may make you more likely to develop this condition: A weak body's defense system, also called the immune system. A condition that affects your lungs and breathing, such as asthma. What are the signs or symptoms? Common symptoms of this condition include: Coughing. This may bring up clear, yellow, or green mucus from your lungs (sputum). Wheezing. Runny or stuffy nose. Having too much mucus in your lungs (chest congestion). Shortness of breath. Aches and pains, including sore throat or chest. How is this diagnosed? This condition is usually diagnosed based on: Your symptoms and medical history. A physical exam. You may also have other tests, including tests to rule out other conditions, such as pneumonia. These tests include: A  test of lung function. Test of a mucus sample to look for the presence of bacteria. Tests to check the oxygen level in your blood. Blood tests. Chest X-ray. How is this treated? Most cases of acute bronchitis clear up over time without treatment. Your health care provider may recommend: Drinking more fluids to help thin your mucus so it is easier to cough up. Taking inhaled medicine (inhaler) to improve air flow in and out of your lungs. Using a vaporizer or a humidifier. These are machines that add water to the air to help you breathe better. Taking a medicine that thins mucus and clears congestion (expectorant). Taking a medicine that prevents or stops coughing (cough suppressant). It is not common to take an antibiotic medicine for this condition. Follow these instructions at home:  Take over-the-counter and prescription medicines only as told by your health care provider. Use an inhaler, vaporizer, or humidifier as told by your  health care provider. Take two teaspoons (10 mL) of honey at bedtime to lessen coughing at night. Drink enough fluid to keep your urine pale yellow. Do not use any products that contain nicotine or tobacco. These products include cigarettes, chewing tobacco, and vaping devices, such as e-cigarettes. If you need help quitting, ask your health care provider. Get plenty of rest. Return to your normal activities as told by your health care provider. Ask your health care provider what activities are safe for you. Keep all follow-up visits. This is important. How is this prevented? To lower your risk of getting this condition again: Wash your hands often with soap and water for at least 20 seconds. If soap and water are not available, use hand sanitizer. Avoid contact with people who have cold symptoms. Try not to touch your mouth, nose, or eyes with your hands. Avoid breathing in smoke or chemical fumes. Breathing smoke or chemical fumes will make your condition  worse. Get the flu shot every year. Contact a health care provider if: Your symptoms do not improve after 2 weeks. You have trouble coughing up the mucus. Your cough keeps you awake at night. You have a fever. Get help right away if you: Cough up blood. Feel pain in your chest. Have severe shortness of breath. Faint or keep feeling like you are going to faint. Have a severe headache. Have a fever or chills that get worse. These symptoms may represent a serious problem that is an emergency. Do not wait to see if the symptoms will go away. Get medical help right away. Call your local emergency services (911 in the [place] Do not drive yourself to the hospital. Summary Acute bronchitis is inflammation of the main airways (bronchi) that come off the windpipe (trachea) in the lungs. The swelling causes the airways to get smaller and make more mucus than normal. Drinking more fluids can help thin your mucus so it is easier to cough up. Take over-the-counter and prescription medicines only as told by your health care provider. Do not use any products that contain nicotine or tobacco. These products include cigarettes, chewing tobacco, and vaping devices, such as e-cigarettes. If you need help quitting, ask your health care provider. Contact a health care provider if your symptoms do not improve after 2 weeks. This information is not intended to replace advice given to you by your health care provider. Make sure you discuss any questions you have with your health care provider. Document Revised: 07/15/2021 Document Reviewed: 08/05/2020 Elsevier Patient Education  2023 Elsevier Inc.    If you have been instructed to have an in-person evaluation today at a local Urgent Care facility, please use the link below. It will take you to a list of all of our available [facility], including address, phone number and hours of operation. Please do not delay care.  [facility]  If  you or a family member do not have a primary care provider, use the link below to schedule a visit and establish care. When you choose a [facility] primary care physician or advanced practice provider, you gain a long-term partner in health. Find a Primary Care Provider  Learn more about [facility] in-office and virtual care options: [facility] - Get Care Now

## 2022-03-29 NOTE — Progress Notes (Signed)
Virtual Visit Consent   [name], you are scheduled for a virtual visit with a [facility] provider today. Just as with appointments in the office, your consent must be obtained to participate. Your consent will be active for this visit and any virtual visit you may have with one of our providers in the next 365 days. If you have a MyChart account, a copy of this consent can be sent to you electronically.  As this is a virtual visit, video technology does not allow for your provider to perform a traditional examination. This may limit your provider's ability to fully assess your condition. If your provider identifies any concerns that need to be evaluated in person or the need to arrange testing (such as labs, EKG, etc.), we will make arrangements to do so. Although advances in technology are sophisticated, we cannot ensure that it will always work on either your end or our end. If the connection with a video visit is poor, the visit may have to be switched to a telephone visit. With either a video or telephone visit, we are not always able to ensure that we have a secure connection.  By engaging in this virtual visit, you consent to the provision of healthcare and authorize for your insurance to be billed (if applicable) for the services provided during this visit. Depending on your insurance coverage, you may receive a charge related to this service.  I need to obtain your verbal consent now. Are you willing to proceed with your visit today? [name] has provided verbal consent on 03/29/2022 for a virtual visit (video or telephone). Margaretann Loveless, PA-C  Date: 03/29/2022 1:26 PM  Virtual Visit via Video Note   I, Margaretann Loveless, connected with  [name]  ([id number], 1965/06/22) on 03/29/22 at  1:30 PM EST by a video-enabled telemedicine application and verified that I am speaking with the correct person using two identifiers.  Location: Patient: Virtual Visit Location  Patient: Other: work; isolated Provider: Engineer, mining Provider: Home Office   I discussed the limitations of evaluation and management by telemedicine and the availability of in person appointments. The patient expressed understanding and agreed to proceed.    History of Present Illness: [name] is a 56 y.o. who identifies as a male who was assigned male at birth, and is being seen today for continued chest congestion. E-visit questionnaire was completed on 03/21/22 and was diagnosed with viral bronchitis. Had tessalon perles and Prednisone 40mg  x 5 days prescribed. Completed treatment and continued to worsen. Then completed a second e-visit questionnaire on 03/26/22 and was prescribed Azithromycin (Z-pack) and Albuterol. Has also received Tussionex cough syrup (old Rx from Pulmonologist) he is using as well. Reports he has been improving with the URI symptoms with the antibiotic, has been able to stop Flonase, but is still continuing to have coughing, chest congestion and chest tightness. Feels that if he could take prednisone with the remaining Azithromycin this could help clear this infection. Does have PMH of pneumonia and reports this is nothing like that. Not having shortness of breath. Has had chest CT in 12/2020 that was positive for some ground-glass pulmonary nodules in right upper lung and atelectasis in bilateral lung bases. These were felt secondary to previous covid 19 infection at the time.   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   OTHER CLOSED FRACTURE OF TARSAL&METATARSAL BONES 04/16/2009   HYPERLIPIDEMIA 07/07/2008   HYPERTENSION, BENIGN 10/08/2007   SEBACEOUS CYST 10/08/2007   ASTHMA 04/20/2007    Allergies:  Allergies  Allergen Reactions   Food Other (See Comments)    Peanuts/walnut allergy confirmed per  allergy testing   Doxycycline Other (See Comments)    Gastrointestinal issues Other reaction(s): Other (See Comments) Gastrointestinal issues   Tetracycline Other (See Comments)    Gastrointestinal issues   Medications:  Current Outpatient Medications:    benzonatate (TESSALON) 100 MG capsule, Take 1 capsule (100 mg total) by mouth 3 (three) times daily as needed., Disp: 30 capsule, Rfl: 0   predniSONE (STERAPRED UNI-PAK 21 TAB) 10 MG (21) TBPK tablet, 6 day taper; take as directed on package instructions, Disp: 21 tablet, Rfl: 0   albuterol (PROAIR HFA) 108 (90 Base) MCG/ACT inhaler, Inhale 1-2 puffs into the lungs every 6 (six) hours as needed for shortness of breath., Disp: 8 g, Rfl: 0   ALPRAZolam (XANAX) 0.25 MG tablet, Take 0.25 mg by mouth 2 (two) times daily as needed for anxiety., Disp: , Rfl:    amLODipine (NORVASC) 10 MG tablet, Take 10 mg by mouth daily. , Disp: , Rfl:    azelastine (ASTELIN) 137 MCG/SPRAY nasal spray, Place 1 spray into both nostrils 3 (three) times daily. (Patient taking differently: Place 1 spray into both nostrils daily.), Disp: 30 mL, Rfl: 6   azithromycin (ZITHROMAX) 250 MG tablet, Take 2 tablets on day 1, then 1 tablet daily on days 2 through 5, Disp: 6 tablet, Rfl: 0   Budeson-Glycopyrrol-Formoterol (BREZTRI AEROSPHERE) 160-9-4.8 MCG/ACT AERO, Inhale into the lungs., Disp: , Rfl:    calcium carbonate (OS-CAL - DOSED IN MG OF ELEMENTAL CALCIUM) 1250 (500 Ca) MG tablet, Take 1 tablet by mouth daily. (Patient not taking: Reported on 12/02/2020), Disp: , Rfl:    cetirizine (ZYRTEC) 10 MG tablet, Take 10 mg by mouth daily. , Disp: , Rfl:    cholecalciferol (VITAMIN D) 1000 units tablet, Take 1,000 Units by mouth daily. , Disp: , Rfl:    fluticasone (FLONASE) 50 MCG/ACT nasal spray, Place 1 spray into both nostrils daily.  (Patient not taking: Reported on 12/02/2020), Disp: , Rfl:    hydrochlorothiazide (HYDRODIURIL) 25 MG tablet, Take 25 mg by mouth daily. , Disp:  , Rfl:    naproxen sodium (ANAPROX) 220 MG tablet, Take 220 mg by mouth daily as needed (for pain.). , Disp: , Rfl:    omeprazole (PRILOSEC) 20 MG capsule, Take 20 mg by mouth daily. , Disp: , Rfl:    Polyethyl Glycol-Propyl Glycol (SYSTANE) 0.4-0.3 % GEL ophthalmic gel, Place 1 application into both eyes at bedtime., Disp: , Rfl:    Polyethyl Glycol-Propyl Glycol 0.4-0.3 % SOLN, Place 1 drop into both eyes daily., Disp: , Rfl:    SUPER B COMPLEX/C PO, Take 1 capsule by mouth at bedtime. (Patient not taking: Reported on 12/02/2020), Disp: , Rfl:    TURMERIC PO, Take 1 capsule by mouth at bedtime. (Patient not taking: Reported on 12/02/2020), Disp: , Rfl:    VITAMIN E PO, Take 1 capsule by mouth daily. (Patient not taking: Reported on 12/02/2020), Disp: , Rfl:   Observations/Objective: Patient is well-developed, well-nourished in no acute distress.  Resting comfortably  Head is normocephalic, atraumatic.  No labored breathing.  Speech is clear and coherent with logical content.  Patient is alert and oriented at baseline.    Assessment  and Plan: 1. Acute bacterial bronchitis - predniSONE (STERAPRED UNI-PAK 21 TAB) 10 MG (21) TBPK tablet; 6 day taper; take as directed on package instructions  Dispense: 21 tablet; Refill: 0 - benzonatate (TESSALON) 100 MG capsule; Take 1 capsule (100 mg total) by mouth 3 (three) times daily as needed.  Dispense: 30 capsule; Refill: 0  - Prednisone and tessalon refilled - Continue Azithromycin until completed - Use cough suppressants as needed - Follow up in person if symptoms continue to worsen or fail to improve  Follow Up Instructions: I discussed the assessment and treatment plan with the patient. The patient was provided an opportunity to ask questions and all were answered. The patient agreed with the plan and demonstrated an understanding of the instructions.  A copy of instructions were sent to the patient via MyChart unless otherwise noted below.     The patient was advised to call back or seek an in-person evaluation if the symptoms worsen or if the condition fails to improve as anticipated.  Time:  I spent 8 minutes with the patient via telehealth technology discussing the above problems/concerns.    Margaretann Loveless, PA-C

## 2022-07-03 ENCOUNTER — Telehealth: Payer: BC Managed Care – PPO | Admitting: Physician Assistant

## 2022-07-03 DIAGNOSIS — M545 Low back pain, unspecified: Secondary | ICD-10-CM | POA: Diagnosis not present

## 2022-07-03 MED ORDER — NAPROXEN 500 MG PO TABS: 500.0000 mg | ORAL_TABLET | Freq: Two times a day (BID) | ORAL | 0 refills | Status: AC

## 2022-07-03 MED ORDER — CYCLOBENZAPRINE HCL 10 MG PO TABS: 5.0000 mg | ORAL_TABLET | Freq: Three times a day (TID) | ORAL | 0 refills | Status: AC | PRN

## 2022-07-03 NOTE — Progress Notes (Signed)

## 2022-09-26 ENCOUNTER — Telehealth: Payer: BC Managed Care – PPO | Admitting: Physician Assistant

## 2022-09-26 DIAGNOSIS — J019 Acute sinusitis, unspecified: Secondary | ICD-10-CM

## 2022-09-26 DIAGNOSIS — R051 Acute cough: Secondary | ICD-10-CM | POA: Diagnosis not present

## 2022-09-26 DIAGNOSIS — B9789 Other viral agents as the cause of diseases classified elsewhere: Secondary | ICD-10-CM

## 2022-09-26 MED ORDER — FLUTICASONE PROPIONATE 50 MCG/ACT NA SUSP: 2.0000 | Freq: Every day | NASAL | 0 refills | Status: AC

## 2022-09-26 MED ORDER — PREDNISONE 10 MG (21) PO TBPK: ORAL_TABLET | ORAL | 0 refills | Status: DC

## 2022-09-26 MED ORDER — PROMETHAZINE-DM 6.25-15 MG/5ML PO SYRP: 5.0000 mL | ORAL_SOLUTION | Freq: Four times a day (QID) | ORAL | 0 refills | Status: AC | PRN

## 2022-09-26 NOTE — Progress Notes (Signed)

## 2022-10-19 ENCOUNTER — Telehealth: Payer: BC Managed Care – PPO | Admitting: Physician Assistant

## 2022-10-19 DIAGNOSIS — J069 Acute upper respiratory infection, unspecified: Secondary | ICD-10-CM

## 2022-10-19 NOTE — Progress Notes (Signed)
Because you were treated via e-visit in the past couple of weeks for a respiratory illness, I feel an examination is warranted. As such,I feel your condition warrants further evaluation and I recommend that you be seen in a face to face visit.   NOTE: There will be NO CHARGE for this eVisit   If you are having a true medical emergency please call 911.      For an urgent face to face visit, [facility] has eight urgent care centers for your convenience:   NEW!! [facility] Center at [facility] Get Driving Directions [phone] [address], [address] [place], [address]    [facility] Health Urgent Care Center at [facility] Get Driving Directions [phone] [address] [place], [place] [address]   [facility] Health Urgent Care Center [facility]) Get Driving Directions [phone] [address] [place], [place] [address]  [facility] Health Urgent Care Center [facility]) Get Driving Directions [phone] [address] [place],  [place]  [address]  [facility] - at [employer] [facility]  [phone] 463 859 [address] [place],  [place] [address]   [facility] Health Urgent Care at [facility] Get Driving Directions [phone] [address] [place], [place] [address]   [facility] Health Urgent Care at [facility] Get Driving Directions  [phone] [address] [place], [place] [address]   [facility] Health Urgent Care at [facility] Directions [phone] [address] [place], [place] [address]  Your MyChart E-visit questionnaire answers were reviewed by a board certified advanced clinical practitioner to complete your personal care plan based on your specific symptoms.  Thank you for using e-Visits.

## 2022-11-24 DIAGNOSIS — I10 Essential (primary) hypertension: Secondary | ICD-10-CM | POA: Diagnosis not present

## 2022-11-24 DIAGNOSIS — F419 Anxiety disorder, unspecified: Secondary | ICD-10-CM | POA: Diagnosis not present

## 2022-11-24 DIAGNOSIS — R0789 Other chest pain: Secondary | ICD-10-CM | POA: Diagnosis not present

## 2022-12-22 DIAGNOSIS — I10 Essential (primary) hypertension: Secondary | ICD-10-CM | POA: Diagnosis not present

## 2023-03-06 DIAGNOSIS — J069 Acute upper respiratory infection, unspecified: Secondary | ICD-10-CM | POA: Diagnosis not present

## 2023-03-06 DIAGNOSIS — R070 Pain in throat: Secondary | ICD-10-CM | POA: Diagnosis not present

## 2023-11-16 DIAGNOSIS — I10 Essential (primary) hypertension: Secondary | ICD-10-CM | POA: Diagnosis not present

## 2023-11-16 DIAGNOSIS — E7800 Pure hypercholesterolemia, unspecified: Secondary | ICD-10-CM | POA: Diagnosis not present

## 2023-11-16 DIAGNOSIS — J4521 Mild intermittent asthma with (acute) exacerbation: Secondary | ICD-10-CM | POA: Diagnosis not present

## 2023-11-16 DIAGNOSIS — Z0000 Encounter for general adult medical examination without abnormal findings: Secondary | ICD-10-CM | POA: Diagnosis not present

## 2023-11-16 DIAGNOSIS — R7303 Prediabetes: Secondary | ICD-10-CM | POA: Diagnosis not present

## 2023-11-16 DIAGNOSIS — F419 Anxiety disorder, unspecified: Secondary | ICD-10-CM | POA: Diagnosis not present

## 2023-11-16 DIAGNOSIS — Z125 Encounter for screening for malignant neoplasm of prostate: Secondary | ICD-10-CM | POA: Diagnosis not present

## 2024-03-31 ENCOUNTER — Telehealth: Admitting: Family

## 2024-03-31 DIAGNOSIS — J4521 Mild intermittent asthma with (acute) exacerbation: Secondary | ICD-10-CM

## 2024-03-31 MED ORDER — AZITHROMYCIN 250 MG PO TABS: ORAL_TABLET | ORAL | 0 refills | Status: AC

## 2024-03-31 MED ORDER — BENZONATATE 100 MG PO CAPS: 100.0000 mg | ORAL_CAPSULE | Freq: Two times a day (BID) | ORAL | 0 refills | Status: AC | PRN

## 2024-03-31 MED ORDER — PREDNISONE 20 MG PO TABS: 40.0000 mg | ORAL_TABLET | Freq: Every day | ORAL | 0 refills | Status: AC

## 2024-03-31 NOTE — Progress Notes (Signed)
 E Visit for Asthma  Based on what you have shared with me, it looks like you may have a flare up of your asthma.  Asthma is a chronic (ongoing) lung disease which results in airway obstruction, inflammation and hyper-responsiveness.   Asthma symptoms vary from person to person, with common symptoms including nighttime awakening and decreased ability to participate in normal activities due to shortness of breath. It is often triggered by changes in weather, changes in the season, changes in air temperature, or inside (home, school, daycare or work) allergens such as animal dander, mold, mildew, woodstoves or cockroaches.   It can also be triggered by hormonal changes, extreme emotion, physical exertion or an upper respiratory tract illness.     It is important to identify the trigger and eliminate or avoid the trigger if possible.   If you have been prescribed medications to be taken on a regular basis, it is important to follow the asthma action plan and to follow guidelines to adjust medication in response to increasing symptoms of decreased peak expiratory flow rate.  Treatment: I have prescribed: Prednisone  40mg  by mouth per day for 5 days, tessalon  for cough, and a zpak, an antibiotic.   HOME CARE Only take medications as instructed by your medical team. Consider wearing a mask or scarf to improve breathing when there is poor air quality as this has been shown to decrease irritation and decrease exacerbations Get rest. Using a humidifier may help nasal congestion and ease sore throat pain. Using a saline nasal spray works much the same way.  Cough drops, hard candies and sore throat lozenges may ease your cough.  Avoid close contacts, especially the very you and the elderly. Cover your mouth if you cough or sneeze. Always remember to wash your hands.   GET HELP RIGHT AWAY  IF: You develop worsening shortness of breath/difficulty breathing or chest tightness, breathlessness at rest, drowsy, confused or agitated, unable to speak in full sentences, or if you develop chest pain.  You have coughing fits. You develop a severe headache or visual changes. You develop shortness of breath, difficulty breathing or start having chest pain. Your symptoms persist after you have completed your treatment plan. If your symptoms do not improve within 5 days.  MAKE SURE YOU Understand these instructions. Will watch your condition. Will get help right away if you are not doing well or get worse.   Your e-visit answers were reviewed by a board certified advanced clinical practitioner to complete your personal care plan, Depending upon the condition, your plan could have included both over the counter or prescription medications.  Please review your pharmacy choice. Your safety is important to us . If you have drug allergies check your prescription carefully. You can use MyChart to ask questions about today's visit, request a non-urgent call back, or ask for a work or school excuse for 24 hours related to this e-Visit. If it has been greater than 24 hours you will need to follow up with your provider, or enter a new e-Visit to address those concerns.  You will get an e-mail in the next two days asking about your experience. I hope that your e-visit has been valuable and will speed your recovery. Thank you for using e-visits.  I have spent 5 minutes in review of e-visit questionnaire, review and updating patient chart, medical decision making and response to patient.   Bari Learn, FNP
